# Patient Record
Sex: Female | Born: 1970 | Race: Black or African American | Hispanic: No | Marital: Single | Smoking: Current every day smoker
Health system: Southern US, Community
[De-identification: ages and names within clinical notes are randomized; demographics above are authoritative.]

## PROBLEM LIST (undated history)

## (undated) DIAGNOSIS — K922 Gastrointestinal hemorrhage, unspecified: Secondary | ICD-10-CM

## (undated) DIAGNOSIS — I10 Essential (primary) hypertension: Secondary | ICD-10-CM

## (undated) DIAGNOSIS — K269 Duodenal ulcer, unspecified as acute or chronic, without hemorrhage or perforation: Secondary | ICD-10-CM

## (undated) DIAGNOSIS — I7772 Dissection of iliac artery: Secondary | ICD-10-CM

## (undated) DIAGNOSIS — E872 Acidosis, unspecified: Secondary | ICD-10-CM

## (undated) DIAGNOSIS — D500 Iron deficiency anemia secondary to blood loss (chronic): Secondary | ICD-10-CM

## (undated) HISTORY — DX: Duodenal ulcer, unspecified as acute or chronic, without hemorrhage or perforation: K26.9

## (undated) HISTORY — DX: Iron deficiency anemia secondary to blood loss (chronic): D50.0

## (undated) HISTORY — DX: Gastrointestinal hemorrhage, unspecified: K92.2

## (undated) HISTORY — DX: Acidosis, unspecified: E87.20

## (undated) HISTORY — DX: Dissection of iliac artery: I77.72

## (undated) HISTORY — PX: LITHOTRIPSY: SUR834

## (undated) HISTORY — PX: MESENTERIC ARTERY BYPASS: SHX5968

## (undated) HISTORY — DX: Acidosis: E87.2

---

## 2016-04-23 ENCOUNTER — Encounter (HOSPITAL_COMMUNITY): Payer: Self-pay

## 2016-04-23 ENCOUNTER — Inpatient Hospital Stay (HOSPITAL_COMMUNITY)
Admission: EM | Admit: 2016-04-23 | Discharge: 2016-04-28 | DRG: 377 | Disposition: A | Discharge status: Home / Self Care | Payer: Medicaid Other | Attending: Internal Medicine | Admitting: Internal Medicine

## 2016-04-23 ENCOUNTER — Emergency Department (HOSPITAL_COMMUNITY): Payer: Medicaid Other

## 2016-04-23 ENCOUNTER — Inpatient Hospital Stay (HOSPITAL_COMMUNITY): Payer: Medicaid Other

## 2016-04-23 DIAGNOSIS — K5900 Constipation, unspecified: Secondary | ICD-10-CM | POA: Diagnosis present

## 2016-04-23 DIAGNOSIS — K922 Gastrointestinal hemorrhage, unspecified: Secondary | ICD-10-CM | POA: Diagnosis not present

## 2016-04-23 DIAGNOSIS — D649 Anemia, unspecified: Secondary | ICD-10-CM | POA: Diagnosis present

## 2016-04-23 DIAGNOSIS — I7772 Dissection of iliac artery: Secondary | ICD-10-CM | POA: Diagnosis present

## 2016-04-23 DIAGNOSIS — S3692XA Contusion of unspecified intra-abdominal organ, initial encounter: Secondary | ICD-10-CM

## 2016-04-23 DIAGNOSIS — B964 Proteus (mirabilis) (morganii) as the cause of diseases classified elsewhere: Secondary | ICD-10-CM | POA: Diagnosis present

## 2016-04-23 DIAGNOSIS — K264 Chronic or unspecified duodenal ulcer with hemorrhage: Secondary | ICD-10-CM | POA: Diagnosis present

## 2016-04-23 DIAGNOSIS — I10 Essential (primary) hypertension: Secondary | ICD-10-CM | POA: Diagnosis present

## 2016-04-23 DIAGNOSIS — F419 Anxiety disorder, unspecified: Secondary | ICD-10-CM | POA: Diagnosis not present

## 2016-04-23 DIAGNOSIS — E871 Hypo-osmolality and hyponatremia: Secondary | ICD-10-CM | POA: Diagnosis present

## 2016-04-23 DIAGNOSIS — M549 Dorsalgia, unspecified: Secondary | ICD-10-CM

## 2016-04-23 DIAGNOSIS — N390 Urinary tract infection, site not specified: Secondary | ICD-10-CM | POA: Diagnosis present

## 2016-04-23 DIAGNOSIS — Z6825 Body mass index (BMI) 25.0-25.9, adult: Secondary | ICD-10-CM | POA: Diagnosis not present

## 2016-04-23 DIAGNOSIS — R531 Weakness: Secondary | ICD-10-CM | POA: Diagnosis present

## 2016-04-23 DIAGNOSIS — A5900 Urogenital trichomoniasis, unspecified: Secondary | ICD-10-CM | POA: Diagnosis present

## 2016-04-23 DIAGNOSIS — T8189XA Other complications of procedures, not elsewhere classified, initial encounter: Secondary | ICD-10-CM | POA: Diagnosis present

## 2016-04-23 DIAGNOSIS — R58 Hemorrhage, not elsewhere classified: Secondary | ICD-10-CM | POA: Diagnosis present

## 2016-04-23 DIAGNOSIS — E872 Acidosis, unspecified: Secondary | ICD-10-CM

## 2016-04-23 DIAGNOSIS — D500 Iron deficiency anemia secondary to blood loss (chronic): Secondary | ICD-10-CM | POA: Diagnosis not present

## 2016-04-23 DIAGNOSIS — Z7902 Long term (current) use of antithrombotics/antiplatelets: Secondary | ICD-10-CM

## 2016-04-23 DIAGNOSIS — M419 Scoliosis, unspecified: Secondary | ICD-10-CM | POA: Diagnosis present

## 2016-04-23 DIAGNOSIS — M5440 Lumbago with sciatica, unspecified side: Secondary | ICD-10-CM | POA: Diagnosis not present

## 2016-04-23 DIAGNOSIS — R634 Abnormal weight loss: Secondary | ICD-10-CM | POA: Diagnosis present

## 2016-04-23 DIAGNOSIS — R0602 Shortness of breath: Secondary | ICD-10-CM

## 2016-04-23 DIAGNOSIS — E860 Dehydration: Secondary | ICD-10-CM | POA: Diagnosis present

## 2016-04-23 DIAGNOSIS — K648 Other hemorrhoids: Secondary | ICD-10-CM | POA: Diagnosis present

## 2016-04-23 DIAGNOSIS — Z79899 Other long term (current) drug therapy: Secondary | ICD-10-CM | POA: Diagnosis not present

## 2016-04-23 DIAGNOSIS — Y838 Other surgical procedures as the cause of abnormal reaction of the patient, or of later complication, without mention of misadventure at the time of the procedure: Secondary | ICD-10-CM | POA: Diagnosis present

## 2016-04-23 DIAGNOSIS — Z7982 Long term (current) use of aspirin: Secondary | ICD-10-CM

## 2016-04-23 DIAGNOSIS — R000 Tachycardia, unspecified: Secondary | ICD-10-CM | POA: Diagnosis present

## 2016-04-23 DIAGNOSIS — D62 Acute posthemorrhagic anemia: Secondary | ICD-10-CM | POA: Diagnosis present

## 2016-04-23 DIAGNOSIS — IMO0001 Reserved for inherently not codable concepts without codable children: Secondary | ICD-10-CM

## 2016-04-23 DIAGNOSIS — K269 Duodenal ulcer, unspecified as acute or chronic, without hemorrhage or perforation: Secondary | ICD-10-CM | POA: Diagnosis not present

## 2016-04-23 DIAGNOSIS — IMO0002 Reserved for concepts with insufficient information to code with codable children: Secondary | ICD-10-CM

## 2016-04-23 HISTORY — DX: Essential (primary) hypertension: I10

## 2016-04-23 LAB — BASIC METABOLIC PANEL
ANION GAP: 6 (ref 5–15)
BUN: 13 mg/dL (ref 6–20)
CHLORIDE: 104 mmol/L (ref 101–111)
CO2: 24 mmol/L (ref 22–32)
Calcium: 8.6 mg/dL — ABNORMAL LOW (ref 8.9–10.3)
Creatinine, Ser: 0.71 mg/dL (ref 0.44–1.00)
GFR calc non Af Amer: >60 mL/min (ref >60)
GLUCOSE: 107 mg/dL — AB (ref 65–99)
POTASSIUM: 3.5 mmol/L (ref 3.5–5.1)
Sodium: 134 mmol/L — ABNORMAL LOW (ref 135–145)

## 2016-04-23 LAB — COMPREHENSIVE METABOLIC PANEL
ALBUMIN: 3.9 g/dL (ref 3.5–5.0)
ALK PHOS: 61 U/L (ref 38–126)
ALT: 15 U/L (ref 14–54)
AST: 34 U/L (ref 15–41)
Anion gap: 11 (ref 5–15)
BUN: 21 mg/dL — AB (ref 6–20)
CHLORIDE: 101 mmol/L (ref 101–111)
CO2: 21 mmol/L — AB (ref 22–32)
CREATININE: 0.75 mg/dL (ref 0.44–1.00)
Calcium: 9.1 mg/dL (ref 8.9–10.3)
GFR calc Af Amer: >60 mL/min (ref >60)
GFR calc non Af Amer: >60 mL/min (ref >60)
GLUCOSE: 125 mg/dL — AB (ref 65–99)
Potassium: 4.3 mmol/L (ref 3.5–5.1)
SODIUM: 133 mmol/L — AB (ref 135–145)
Total Bilirubin: 0.8 mg/dL (ref 0.3–1.2)
Total Protein: 7.1 g/dL (ref 6.5–8.1)

## 2016-04-23 LAB — CBC WITH DIFFERENTIAL/PLATELET
BASOS ABS: 0 10*3/uL (ref 0.0–0.1)
Basophils Absolute: 0 10*3/uL (ref 0.0–0.1)
Basophils Relative: 0 %
Basophils Relative: 0 %
EOS ABS: 0 10*3/uL (ref 0.0–0.7)
EOS ABS: 0.1 10*3/uL (ref 0.0–0.7)
EOS PCT: 0 %
Eosinophils Relative: 1 %
HCT: 20.1 % — ABNORMAL LOW (ref 36.0–46.0)
HEMATOCRIT: 24.4 % — AB (ref 36.0–46.0)
HEMOGLOBIN: 6.7 g/dL — AB (ref 12.0–15.0)
HEMOGLOBIN: 8.3 g/dL — AB (ref 12.0–15.0)
LYMPHS ABS: 2.2 10*3/uL (ref 0.7–4.0)
LYMPHS PCT: 17 %
Lymphocytes Relative: 17 %
Lymphs Abs: 2.8 10*3/uL (ref 0.7–4.0)
MCH: 32.8 pg (ref 26.0–34.0)
MCH: 31.3 pg (ref 26.0–34.0)
MCHC: 33.3 g/dL (ref 30.0–36.0)
MCHC: 34 g/dL (ref 30.0–36.0)
MCV: 98.5 fL (ref 78.0–100.0)
MCV: 92.1 fL (ref 78.0–100.0)
MONOS PCT: 7 %
Monocytes Absolute: 0.9 10*3/uL (ref 0.1–1.0)
Monocytes Absolute: 0.9 10*3/uL (ref 0.1–1.0)
Monocytes Relative: 6 %
NEUTROS ABS: 9.6 10*3/uL — AB (ref 1.7–7.7)
NEUTROS PCT: 77 %
NEUTROS PCT: 75 %
Neutro Abs: 12.4 10*3/uL — ABNORMAL HIGH (ref 1.7–7.7)
PLATELETS: 400 10*3/uL (ref 150–400)
Platelets: 252 10*3/uL (ref 150–400)
RBC: 2.04 MIL/uL — AB (ref 3.87–5.11)
RBC: 2.65 MIL/uL — ABNORMAL LOW (ref 3.87–5.11)
RDW: 19.1 % — ABNORMAL HIGH (ref 11.5–15.5)
RDW: 19.4 % — ABNORMAL HIGH (ref 11.5–15.5)
WBC: 16.1 10*3/uL — AB (ref 4.0–10.5)
WBC: 12.8 10*3/uL — ABNORMAL HIGH (ref 4.0–10.5)

## 2016-04-23 LAB — I-STAT CG4 LACTIC ACID, ED: Lactic Acid, Venous: 2.86 mmol/L (ref 0.5–1.9)

## 2016-04-23 LAB — I-STAT CHEM 8, ED
BUN: 20 mg/dL (ref 6–20)
CREATININE: 0.6 mg/dL (ref 0.44–1.00)
Calcium, Ion: 1.15 mmol/L (ref 1.15–1.40)
Chloride: 97 mmol/L — ABNORMAL LOW (ref 101–111)
Glucose, Bld: 117 mg/dL — ABNORMAL HIGH (ref 65–99)
HEMATOCRIT: 20 % — AB (ref 36.0–46.0)
Hemoglobin: 6.8 g/dL — CL (ref 12.0–15.0)
POTASSIUM: 3.6 mmol/L (ref 3.5–5.1)
Sodium: 134 mmol/L — ABNORMAL LOW (ref 135–145)
TCO2: 25 mmol/L (ref 0–100)

## 2016-04-23 LAB — MRSA PCR SCREENING: MRSA by PCR: NEGATIVE

## 2016-04-23 LAB — URINALYSIS, ROUTINE W REFLEX MICROSCOPIC
Bilirubin Urine: NEGATIVE
GLUCOSE, UA: NEGATIVE mg/dL
KETONES UR: NEGATIVE mg/dL
Nitrite: NEGATIVE
PH: 6 (ref 5.0–8.0)
Protein, ur: NEGATIVE mg/dL
Specific Gravity, Urine: 1.01 (ref 1.005–1.030)

## 2016-04-23 LAB — PREPARE RBC (CROSSMATCH)

## 2016-04-23 LAB — PROTIME-INR
INR: 1.05
INR: 1.14
PROTHROMBIN TIME: 13.7 s (ref 11.4–15.2)
Prothrombin Time: 14.6 seconds (ref 11.4–15.2)

## 2016-04-23 LAB — URINE MICROSCOPIC-ADD ON

## 2016-04-23 LAB — ABO/RH: ABO/RH(D): O POS

## 2016-04-23 LAB — MAGNESIUM: Magnesium: 1.8 mg/dL (ref 1.7–2.4)

## 2016-04-23 LAB — LACTIC ACID, PLASMA: Lactic Acid, Venous: 1.7 mmol/L (ref 0.5–1.9)

## 2016-04-23 LAB — TSH: TSH: 0.725 u[IU]/mL (ref 0.350–4.500)

## 2016-04-23 LAB — I-STAT BETA HCG BLOOD, ED (MC, WL, AP ONLY): I-stat hCG, quantitative: <5 m[IU]/mL (ref <5)

## 2016-04-23 LAB — LIPASE, BLOOD: Lipase: 32 U/L (ref 11–51)

## 2016-04-23 MED ORDER — ACETAMINOPHEN 325 MG PO TABS: 650.0000 mg | ORAL_TABLET | Freq: Four times a day (QID) | ORAL | Status: DC | PRN

## 2016-04-23 MED ORDER — LIDOCAINE-EPINEPHRINE (PF) 1 %-1:200000 IJ SOLN
10.0000 mL | Freq: Once | INTRAMUSCULAR | Status: DC
  Filled 2016-04-23: qty 30

## 2016-04-23 MED ORDER — ONDANSETRON HCL 4 MG/2ML IJ SOLN
4.0000 mg | Freq: Four times a day (QID) | INTRAMUSCULAR | Status: DC | PRN
  Administered 2016-04-24 – 2016-04-25 (×3): 4 mg via INTRAVENOUS
  Filled 2016-04-23 (×5): qty 2

## 2016-04-23 MED ORDER — DEXTROSE-NACL 5-0.45 % IV SOLN
INTRAVENOUS | Status: DC
  Administered 2016-04-23: 14:00:00 via INTRAVENOUS

## 2016-04-23 MED ORDER — IOPAMIDOL (ISOVUE-370) INJECTION 76%
100.0000 mL | Freq: Once | INTRAVENOUS | Status: AC | PRN
  Administered 2016-04-23: 100 mL via INTRAVENOUS

## 2016-04-23 MED ORDER — TECHNETIUM TC 99M-LABELED RED BLOOD CELLS IV KIT
26.4000 | PACK | Freq: Once | INTRAVENOUS | Status: AC | PRN
  Administered 2016-04-23: 26.4 via INTRAVENOUS

## 2016-04-23 MED ORDER — ONDANSETRON HCL 4 MG PO TABS: 4.0000 mg | ORAL_TABLET | Freq: Four times a day (QID) | ORAL | Status: DC | PRN

## 2016-04-23 MED ORDER — METRONIDAZOLE 500 MG PO TABS
2000.0000 mg | ORAL_TABLET | Freq: Once | ORAL | Status: AC
  Administered 2016-04-23: 2000 mg via ORAL
  Filled 2016-04-23: qty 4

## 2016-04-23 MED ORDER — SODIUM CHLORIDE 0.9 % IV SOLN: 10.0000 mL/h | Freq: Once | INTRAVENOUS | Status: AC

## 2016-04-23 MED ORDER — METOPROLOL TARTRATE 12.5 MG HALF TABLET
12.5000 mg | ORAL_TABLET | Freq: Two times a day (BID) | ORAL | Status: DC
Start: 2016-04-23 — End: 2016-04-28
  Administered 2016-04-23 – 2016-04-28 (×11): 12.5 mg via ORAL
  Filled 2016-04-23 (×11): qty 1

## 2016-04-23 MED ORDER — PHENYLEPHRINE 200 MCG/ML FOR PRIAPISM / HYPOTENSION
500.0000 ug | Freq: Once | INTRAMUSCULAR | Status: DC
  Filled 2016-04-23: qty 50

## 2016-04-23 MED ORDER — SODIUM CHLORIDE 0.9 % IV BOLUS (SEPSIS)
1000.0000 mL | Freq: Once | INTRAVENOUS | Status: AC
  Administered 2016-04-23: 1000 mL via INTRAVENOUS

## 2016-04-23 MED ORDER — SODIUM CHLORIDE 0.9 % IV SOLN: 10.0000 mL/h | Freq: Once | INTRAVENOUS | Status: DC

## 2016-04-23 MED ORDER — BOOST / RESOURCE BREEZE PO LIQD
1.0000 | Freq: Three times a day (TID) | ORAL | Status: DC
  Administered 2016-04-24 – 2016-04-28 (×9): 1 via ORAL

## 2016-04-23 MED ORDER — ACETAMINOPHEN 650 MG RE SUPP: 650.0000 mg | Freq: Four times a day (QID) | RECTAL | Status: DC | PRN

## 2016-04-23 MED ORDER — CEFTRIAXONE SODIUM 1 G IJ SOLR: 1.0000 g | Freq: Once | INTRAMUSCULAR | Status: DC

## 2016-04-23 MED ORDER — MAGNESIUM HYDROXIDE 400 MG/5ML PO SUSP: 30.0000 mL | Freq: Every day | ORAL | Status: DC | PRN

## 2016-04-23 MED ORDER — ENSURE ENLIVE PO LIQD: 237.0000 mL | Freq: Two times a day (BID) | ORAL | Status: DC

## 2016-04-23 MED ORDER — AMLODIPINE BESYLATE 10 MG PO TABS
10.0000 mg | ORAL_TABLET | Freq: Every day | ORAL | Status: DC
  Administered 2016-04-23 – 2016-04-28 (×6): 10 mg via ORAL
  Filled 2016-04-23: qty 2
  Filled 2016-04-23 (×5): qty 1

## 2016-04-23 MED ORDER — SODIUM CHLORIDE 0.9 % IV SOLN
INTRAVENOUS | Status: DC
  Administered 2016-04-23 – 2016-04-24 (×4): via INTRAVENOUS
  Administered 2016-04-26: 100 mL/h via INTRAVENOUS
  Administered 2016-04-27: 12:00:00 via INTRAVENOUS

## 2016-04-23 MED ORDER — HYDROCODONE-ACETAMINOPHEN 5-325 MG PO TABS
1.0000 | ORAL_TABLET | ORAL | Status: DC | PRN
  Administered 2016-04-24 – 2016-04-28 (×10): 2 via ORAL
  Filled 2016-04-23 (×10): qty 2

## 2016-04-23 MED ORDER — METRONIDAZOLE IVPB CUSTOM: 2.0000 g | Freq: Once | INTRAVENOUS | Status: DC

## 2016-04-23 NOTE — Progress Notes (Signed)
[facility] Update Note  Called to bedside about GI bleed. Consulted GI who will see patient. Examined paitent alert and oriented, no neurological deficits, Ultram these well perfused, no abdominal pain, normal bowel sounds, on rectal exam there is frank blood but no other abnormalities. Regular rate and rhythm, no murmurs or gallops. Lungs clear to all fixation bilaterally.  A mix of bright red blood and melena in the commode in the patient's room.  Repeat CBC has been ordered. Patient to get type and screened.  Will call Vascular surgery.  Elwin Mocha MD

## 2016-04-23 NOTE — ED Notes (Signed)
This RN attempted IV x1 

## 2016-04-23 NOTE — ED Notes (Signed)
Two RNs have attempted to obtain IV access. IV team consult put in and EDP notified

## 2016-04-23 NOTE — ED Notes (Signed)
Called CT to inquire about delay, they stated pt is next to have CT done

## 2016-04-23 NOTE — Consult Note (Signed)
Referring Provider:  [facility] Primary Care Physician:  No PCP Per Patient Primary Gastroenterologist:  none Reason for Consultation:  GI bleed  HPI: Michelle Joyce is a 45 y.o. female from [place] who presented to ED around midnight with weakness and vomiting. Patient was in good health until late September when she was hospitalized three times. Patient presented to [facility] in [place] in September with abdominal pain, she underwent SMA stenting for stenosis. Started on plavix post-procedure.  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 Following that she developed a right groin hematoma (surgically evacuated ) and a retroperitoneal hematoma. Following all this patient was admitted a third time with constipation  / impaction. After bowel purge patient felt much better. She did require blood transfusion during two of the recent admissions. Patient had not passed any blood, stools were dark but not black. There was no mention of GI bleeding during any of those admission.    Patient came to [facility] on Saturday, she plans to relocate here to be with family. Yesterday she became extremely weak with nausea and loose nonbloody stool. She reports 2 syncopal episodes at home. Patient went to [facility] ED earlier this am.  Hgb 6-7. CTA showed hematoma extending from right inguinal and right iliac vessels into the pelvis, likely subacute, patient transferred to [facility] in case surgical intervention was needed. Upon arriving to [facility] patient had a large dark bloody BM. This is the FIRST time patient has ever passed blood. She vomited yesterday (nonbloody) with syncopal episodes but otherwise no vomiting. She has no abdominal pain. Her LMP ws ~ 2 years ago.   Past Medical History:  Diagnosis Date  . Hypertension     Past Surgical History:  Procedure Laterality Date  . MESENTERIC ARTERY BYPASS      Prior to Admission medications     Medication Sig Start Date End Date Taking? Authorizing Provider  amLODipine (NORVASC) 10 MG tablet Take 10 mg by mouth daily.   Yes Historical Provider, MD  aspirin EC 81 MG tablet Take 81 mg by mouth daily.   Yes Historical Provider, MD  clopidogrel (PLAVIX) 75 MG tablet Take 75 mg by mouth daily.   Yes Historical Provider, MD  metoprolol succinate (TOPROL-XL) 25 MG 24 hr tablet Take 12.5 mg by mouth daily.   Yes Historical Provider, MD  ondansetron (ZOFRAN-ODT) 4 MG disintegrating tablet Take 4 mg by mouth every 6 (six) hours as needed for nausea or vomiting.   Yes Historical Provider, MD  polyethylene glycol (MIRALAX / GLYCOLAX) packet Take 17 g by mouth daily.   Yes Historical Provider, MD    Current Facility-Administered Medications  Medication Dose Route Frequency Provider Last Rate Last Dose  . 0.9 %  sodium chloride infusion  10 mL/hr Intravenous Once Everlene Balls, MD      . 0.9 %  sodium chloride infusion   Intravenous Continuous Elwin Mocha, MD      . acetaminophen (TYLENOL) tablet 650 mg  650 mg Oral Q6H PRN Debbe Odea, MD       Or  . acetaminophen (TYLENOL) suppository 650 mg  650 mg Rectal Q6H PRN Debbe Odea, MD      . amLODipine (NORVASC) tablet 10 mg  10 mg Oral Daily Debbe Odea, MD   10 mg at 04/23/16 1031  . dextrose 5 %-0.45 % sodium chloride infusion   Intravenous Continuous Debbe Odea, MD 75 mL/hr at 04/23/16 1400    .  feeding supplement (BOOST / RESOURCE BREEZE) liquid 1 Container  1 Container Oral TID BM Rise Patience, MD      . HYDROcodone-acetaminophen (NORCO/VICODIN) 5-325 MG per tablet 1-2 tablet  1-2 tablet Oral Q4H PRN Debbe Odea, MD      . magnesium hydroxide (MILK OF MAGNESIA) suspension 30 mL  30 mL Oral Daily PRN Debbe Odea, MD      . metoprolol tartrate (LOPRESSOR) tablet 12.5 mg  12.5 mg Oral BID Debbe Odea, MD   12.5 mg at 04/23/16 1032  . ondansetron (ZOFRAN) tablet 4 mg  4 mg Oral Q6H PRN Debbe Odea, MD       Or  . ondansetron  (ZOFRAN) injection 4 mg  4 mg Intravenous Q6H PRN Debbe Odea, MD        Allergies as of 04/23/2016  . (No Known Allergies)    History reviewed. No pertinent family history.  Social History   Social History  . Marital status: Single    Spouse name: N/A  . Number of children: N/A  . Years of education: N/A   Occupational History  . Not on file.   Social History Main Topics  . Smoking status: Never Smoker  . Smokeless tobacco: Never Used  . Alcohol use No  . Drug use: Unknown  . Sexual activity: Not on file   Other Topics Concern  . Not on file   Social History Narrative  . No narrative on file    Review of Systems: Positive for 15-20 pound weight loss since September. All systems reviewed and negative except where noted in HPI.  Physical Exam: Vital signs in last 24 hours: Temp:  [98.3 F (36.8 C)-99.2 F (37.3 C)] 99.2 F (37.3 C) (10/20 1330) Pulse Rate:  [93-131] 94 (10/20 1330) Resp:  [12-24] 24 (10/20 1330) BP: (115-168)/(73-102) 138/86 (10/20 1330) SpO2:  [98 %-100 %] 100 % (10/20 1330) Weight:  [150 lb 9.2 oz (68.3 kg)] 150 lb 9.2 oz (68.3 kg) (10/20 0915)   General:   Alert,  well-developed, black female in NAD Head:  Normocephalic and atraumatic. Eyes:  Sclera clear, no icterus.   Conjunctiva pale. Ears:  Normal auditory acuity. Nose:  No deformity, discharge,  or lesions. Neck:  Supple; no masses  Lungs:  Clear throughout to auscultation.   No wheezes, crackles, or rhonchi.  Heart:  Regular rate and rhythm; no murmurs, clicks, rubs,  or gallops. Abdomen:  Soft, nondistended, a few bowel sounds. Mild epigastric tenderness,  Rectal:  Scant dark red secretions in vault.   Msk:  Symmetrical without gross deformities. . Pulses:  Normal pulses noted. Extremities:  Without clubbing or edema. Slightly malodorous right groin wound at site of recent vascular surgeries.  Neurologic:  Alert and  oriented x4;  grossly normal neurologically. Skin:  Intact  without significant lesions or rashes.. Psych:  Alert and cooperative. Normal mood and affect.  Intake/Output from previous day: 10/19 0701 - 10/20 0700 In: 323 [Blood:323] Out: 300 [Urine:300] Intake/Output this shift: Total I/O In: 366.3 [I.V.:31.3; Blood:335] Out: -   Lab Results:  Recent Labs  04/23/16 0111 04/23/16 0253  WBC 16.1*  --   HGB 6.7* 6.8*  HCT 20.1* 20.0*  PLT 400  --    BMET  Recent Labs  04/23/16 0111 04/23/16 0253  NA 133* 134*  K 4.3 3.6  CL 101 97*  CO2 21*  --   GLUCOSE 125* 117*  BUN 21* 20  CREATININE 0.75 0.60  CALCIUM 9.1  --  LFT  Recent Labs  04/23/16 0111  PROT 7.1  ALBUMIN 3.9  AST 34  ALT 15  ALKPHOS 61  BILITOT 0.8   PT/INR  Recent Labs  04/23/16 0111  LABPROT 13.7  INR 1.05   Studies/Results: Ct Angio Abd/pel W And/or Wo Contrast  Result Date: 04/23/2016 CLINICAL DATA:  45 year old female with 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 Patient presenting with weakness and abdominal pain. EXAM: CTA ABDOMEN AND PELVIS wITHOUT AND WITH CONTRAST TECHNIQUE: Multidetector CT imaging of the abdomen and pelvis was performed using the standard protocol during bolus administration of intravenous contrast. Multiplanar reconstructed images and MIPs were obtained and reviewed to evaluate the vascular anatomy. CONTRAST:  100 cc Isovue 370 COMPARISON:  None. FINDINGS: Evaluation is limited due to respiratory motion artifact. VASCULAR Aorta: There is diffuse predominantly noncalcified plaque along the course of the abdominal aorta. No aneurysmal dilatation or evidence of dissection. There is partial occlusion of the right external iliac artery with a double-lumen appearance of the distal portion of the right external iliac artery concerning for dissection, possibly iatrogenic. Evaluation of these vessels however very limited due to motion artifact. There is also partial occlusion of the right common femoral artery.  There is a 8.2 x 8.2 cm walled-off high attenuating fluid collection extending from the right inguinal and right iliac area into the pelvis above the bladder most compatible with a hematoma, possibly subacute. No definite extravasation of contrast noted within this collection to suggest active bleed. Superimposed infection is not excluded. Celiac: The origin of the celiac axis appears patent. There is an accessory left hepatic artery arising from the left gastric artery. SMA: A SMA stent is noted and appears patent. Evaluation of the stent however is limited due to respiratory motion artifact. No definite extravasation of contrast or teres stent infiltration identified. The distal SMA appears patent. Renals: Both renal arteries are patent without evidence of aneurysm, dissection, vasculitis, fibromuscular dysplasia or significant stenosis. IMA: Appears unremarkable as visualized. Veins: The IVC is grossly unremarkable. The SMV, splenic vein, and main portal vein are patent. No portal venous gas identified. Review of the MIP images confirms the above findings. NON-VASCULAR Lower chest: The visualized lung bases are clear. No intra-abdominal free air or free fluid Hepatobiliary: The liver appears unremarkable. No intrahepatic biliary ductal dilatation. Vicarious excretion of contrast into the gallbladder noted. Pancreas: Unremarkable. No pancreatic ductal dilatation or surrounding inflammatory changes. Spleen: Normal in size without focal abnormality. Adrenals/Urinary Tract: Ill-defined 1.4 cm nodularity of the left adrenal gland, indeterminate. The right adrenal gland appears unremarkable. The kidneys, visualized ureters appear unremarkable. There is diffuse thickened appearance of the bladder wall which may be secondary to inflammatory changes and hematoma within the pelvis. Correlation with urinalysis recommended to evaluate for cystitis. Stomach/Bowel: Multiple nondistended fluid filled loops of small bowel noted  throughout the abdomen. Loose stool noted within the colon compatible with diarrheal state. There is no evidence of bowel obstruction. Lymphatic: No adenopathy. Reproductive: The uterus is grossly unremarkable. Other: Right inguinal subcutaneous stranding/scarring, likely related to femoral access. There is a small supraumbilical fat containing hernia. Musculoskeletal: No acute or significant osseous findings. IMPRESSION: VASCULAR No evidence of aortic aneurysm or dissection. SMA stent appears unremarkable and patent. Double-lumen appearance of the right external iliac artery concerning for dissection, possibly iatrogenic. There is an 8.2 x 8.2 cm loculated hematoma extending from the right inguinal and right iliac vessels into the pelvis above the urinary bladder, likely subacute. No definite  evidence of active bleed. Evaluation of the vasculature is limited due to motion artifact. NON-VASCULAR No acute intra-abdominal pelvic pathology. **An incidental finding of potential clinical significance has been found. A 1.4 cm probable left adrenal nodule, indeterminate. Nonemergent MRI without and with contrast is recommended for further evaluation.** Thickened appearance of the bladder wall, possibly reactive to the hematoma within the pelvis. Correlation with urinalysis recommended to exclude cystitis. These results were called by telephone at the time of interpretation on 04/23/2016 at 6:35 am to Dr. Everlene Balls , who verbally acknowledged these results. Electronically Signed   By: Anner Crete M.D.   On: 04/23/2016 06:36    IMPRESSION / PLAN:    23. 45 year old female with painless hematochezia on plavix and ASA. Hgb down a couple of grams since Oct 8th. 8.2 >>>> 6.7 today.  In toilet there is a large amount of dark red liquid which just started this afternoon..  -hold plavix -will drop NGT to look for upper bleed, if + then EGD now. If negative then RBC scan.     2. Anemia of acute blood loss. Received  several units of blood in [place] in Sept following vascular procedures complicated by right groin and retroperitoneal hematoma. Hgb low 8 range upon hospital discharge in [place] around Oct 8th. Presents today with hgb of 6.7 ( Transfused 2 units of blood, follow up hgb only 6.8. Now having new onset hematochezia so suspect hgb has fallen further since last H+H.  -repeat stat BMET, CBC, INR stat.  -NGT to aspirate gastric contents to help discern if this is brisk upper bleed though she seems too hemodynamically stable for that. -Will likely additional transfusion  2. 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 Following this she developed a right groin hematoma (requring surgical evacuation) and a right retroperitoneal bleed).  Presented to [facility] ED today with weakness, CTA suggested dissection of right external iliac artery. Vascular Surgery recommends conservative measures for now.     Tye Savoy  04/23/2016, 4:55 PM  Pager number [phone]

## 2016-04-23 NOTE — ED Notes (Signed)
Bed: WA09 Expected date: 04/23/16 Expected time:  Means of arrival:  Comments: Hold/ pt in room

## 2016-04-23 NOTE — ED Notes (Signed)
Per pt's request transfusion restarted, Dr Claudine Mouton evaluated IV site.

## 2016-04-23 NOTE — ED Notes (Signed)
New IV line established, transfusion restarted, pt denies any pain or discomfort at this time

## 2016-04-23 NOTE — Consult Note (Signed)
[facility] Nurse wound consult note Reason for Consult: Right inguinal nonhealing surgical wound.  May be trainsferring to [facility] bedside RN Wound type:surgical Pressure Ulcer POA: N/A Measurement:0.5 cm in diameter, depth 0.5 cm  Wound [id number] pink, nongranulating Drainage (amount, consistency, odor) Minimal serosanguinous.  No odor Periwound:Intact Dressing procedure/placement/frequency:Cleanse wound to right inguinal fold with NS.  NS moist packing strip to wound twice daily.  Cover with 4x4 gauze/tape.  Will not follow at this time.  Please re-consult if needed.  Domenic Moras RN BSN [address] Pager [phone]

## 2016-04-23 NOTE — ED Notes (Signed)
Transfusion stopped again due to pt c/o pain and heaviness, Dr Claudine Mouton at bedside for ultrasound IV insertion

## 2016-04-23 NOTE — ED Notes (Signed)
Pt refused hemoccult collection.

## 2016-04-23 NOTE — Consult Note (Signed)
Consult Note  Patient name: Michelle Joyce MRN: [id number] DOB: February 28, 1971 Sex: female  Consulting Physician:  Hospital service  Reason for Consult:  Chief Complaint  Patient presents with  . Abdominal Pain    HISTORY OF PRESENT ILLNESS: 45 year old female presented to the ER last night with weakness.  She has just moved from [place].  She has a recent history of SMA stenting for abdominal pain in [place].  This was complicated by what sounds like a pseudoaneurysm from the right femoral cannulation site.  She underwent several groin explorations for bleeding control and still ahs a open wound from the incisions.  While in the ER a CT scan reveled a right EIA and CFA dissection and blood in the pelvis which does not appear acute.  She was transferred to [facility] and just had a stool with melanotic and bright red blood.  Gi has been consulted.  She has been taking multiple medications for constipation.  She was noted to have Trichomonas UTI   Past Medical History:  Diagnosis Date  . Hypertension     Past Surgical History:  Procedure Laterality Date  . MESENTERIC ARTERY BYPASS      Social History   Social History  . Marital status: Single    Spouse name: N/A  . Number of children: N/A  . Years of education: N/A   Occupational History  . Not on file.   Social History Main Topics  . Smoking status: Never Smoker  . Smokeless tobacco: Never Used  . Alcohol use No  . Drug use: Unknown  . Sexual activity: Not on file   Other Topics Concern  . Not on file   Social History Narrative  . No narrative on file    History reviewed. No pertinent family history.  Allergies as of 04/23/2016  . (No Known Allergies)    No current facility-administered medications on file prior to encounter.    No current outpatient prescriptions on file prior to encounter.     REVIEW OF SYSTEMS: Complains of having to spit a lot/ too much saliva 15- 20 lb wt loss since  Sept Numbness in right inner thigh Wound in right groin- packing with gauze soaked in saline twice a day  All other systems reviewed and apart from HPI, are negative.  PHYSICAL EXAMINATION: General: The patient appears their stated age.  Vital signs are BP 136/84 (BP Location: Right Arm)   Pulse 97   Temp 98.6 F (37 C) (Oral)   Resp 18   Ht 5\' 4"  (1.626 m)   Wt 150 lb 9.2 oz (68.3 kg)   LMP 04/23/2014 (Approximate) Comment: negative beta HCG 04/23/16  SpO2 98%   BMI 25.85 kg/m  Pulmonary: Respirations are non-labored HEENT:  No gross abnormalities Abdomen: Soft and non-tender  Musculoskeletal: There are no major deformities.   Neurologic: No focal weakness or paresthesias are detected, Skin: There are no ulcer or rashes noted. Psychiatric: The patient has normal affect. Cardiovascular: There is a regular rate and rhythm.  Palpable bilateral DP pulsses  Diagnostic Studies: I have reviewed her CTA with the following findings: No evidence of aortic aneurysm or dissection. SMA stent appears unremarkable and patent.  Double-lumen appearance of the right external iliac artery concerning for dissection, possibly iatrogenic. There is an 8.2 x 8.2 cm loculated hematoma extending from the right inguinal and right iliac vessels into the pelvis above the urinary bladder, likely subacute. No definite evidence of  active bleed. Evaluation of the vasculature is limited due to motion artifact.   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  Given her recent multiple interventions, I would not recommend any treatment at this time, other than anti-platelet medications.  He RP hematoma appears to be similar to what is described from her CT scan reports from [place].  No exploration is indicated  GI bleed:  I suspect this is secondary to her laxitive  use for constipation and fecal imapction.  I would work this up like a GI bleed.  I do not think this appears to be a fistula to her arterial system.  She is scheduled for a tagged red cell scan.  If she required IR embolization, it would be beneficial to evaluate her right iliac and femoral arteries     V. Leia Alf, M.D. [facility] Office: [phone] Pager:  [phone]

## 2016-04-23 NOTE — ED Notes (Signed)
Patient transported to CT 

## 2016-04-23 NOTE — ED Notes (Signed)
Transfusion stopped due to pt c/o pain at the IV site, EDP notified

## 2016-04-23 NOTE — H&P (Signed)
History and Physical    Michelle Joyce [id number] DOB: 1970-07-08 DOA: 04/23/2016    PCP: No PCP Per Patient  Patient coming from: home  Chief Complaint: weakness  HPI: Michelle Joyce is a 45 y.o. 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 She was hospitalized for 2 weeks and required 2 blood transfusions during the hospital stay. She presents today for weakness that started this morning. She states that she does not have any pain. She has had 2 episodes of diarrhea this morning and almost passed out while on the commode. She states she felt hot, broke out in a sweat. She takes Miralax for constipation.  She is mildly nauseated now but has not had any vomiting. No abdominal pain, fever or chills.  Noted to have Trichomonas UTI - does not admit to urinary symptoms  ED Course: Hemoglobin 6.7, WBC count 16.1, lactic acid 2.86, sodium 134, chloride 97, UA shows moderate leukocytosis, trace hemoglobin, Trichomonas and too numerous to count WBCs. CTA shows a patent SMA stent, possible dissection of right internal iliac artery, 8.2 x 8.2 cm loculated hematoma extending from the right inguinal and right iliac vessels into the pelvis above the urinary bladder, likely subacute to 1.4 cm left adrenal nodule.  Review of Systems:  Complains of having to spit a lot/ too much saliva 15- 20 lb wt loss since Sept Numbness in right inner thigh Wound in right groin- packing with gauze soaked in saline twice a day  All other systems reviewed and apart from HPI, are negative.  Past Medical History:  Diagnosis Date  . Hypertension     Past Surgical History:  Procedure Laterality Date  . MESENTERIC ARTERY BYPASS      Social History:   reports that she has never smoked. She has never used smokeless tobacco. She reports that she does not drink alcohol. Her drug history is not on file.  -  Moved from [place] about 1 wk ago. Staying with her aunt here  No Known Allergies  History reviewed. No pertinent family history.   Prior to Admission medications   Medication Sig Start Date End Date Taking? Authorizing Provider  amLODipine (NORVASC) 10 MG tablet Take 10 mg by mouth daily.   Yes Historical Provider, MD  aspirin EC 81 MG tablet Take 81 mg by mouth daily.   Yes Historical Provider, MD  clopidogrel (PLAVIX) 75 MG tablet Take 75 mg by mouth daily.   Yes Historical Provider, MD  ondansetron (ZOFRAN-ODT) 4 MG disintegrating tablet Take 4 mg by mouth every 6 (six) hours as needed for nausea or vomiting.   Yes Historical Provider, MD  polyethylene glycol (MIRALAX / GLYCOLAX) packet Take 17 g by mouth daily.   Yes Historical Provider, MD    Physical Exam: Vitals:   04/23/16 [id number] 04/23/16 0645 04/23/16 0649 04/23/16 0715  BP: 148/90 148/89  160/91  Pulse:      Resp: 20 16  18   Temp:   98.6 F (37 C)   TempSrc:      SpO2:   100%       Constitutional: NAD, calm, comfortable Eyes: PERTLA, lids and conjunctivae normal ENMT: Mucous membranes are moist. Posterior pharynx clear of any exudate or lesions. Normal dentition.  Neck: normal, supple, no masses, no thyromegaly Respiratory: clear to auscultation bilaterally, no wheezing, no crackles. Normal respiratory effort. No accessory muscle use.  Cardiovascular: S1 & S2  heard, regular rate and rhythm, no murmurs / rubs / gallops. No extremity edema. 2+ pedal pulses. No carotid bruits.  Abdomen: No distension, tender in right groin, no masses palpated. No hepatosplenomegaly. Bowel sounds normal.  Musculoskeletal: no clubbing / cyanosis. No joint deformity upper and lower extremities. Good ROM, no contractures. Normal muscle tone.  Skin:  Small round wound about 2 cm in right groin, no discharge or cellulitic changes, no otherrashes, lesions, ulcers. No induration Neurologic: CN 2-12 grossly intact. Sensation intact, DTR normal.  Strength 5/5 in all 4 limbs.  Psychiatric: Normal judgment and insight. Alert and oriented x 3. Flat affect    Labs on Admission: I have personally reviewed following labs and imaging studies  CBC:  Recent Labs Lab 04/23/16 0111 04/23/16 0253  WBC 16.1*  --   NEUTROABS 12.4*  --   HGB 6.7* 6.8*  HCT 20.1* 20.0*  MCV 98.5  --   PLT 400  --    Basic Metabolic Panel:  Recent Labs Lab 04/23/16 0111 04/23/16 0253  NA 133* 134*  K 4.3 3.6  CL 101 97*  CO2 21*  --   GLUCOSE 125* 117*  BUN 21* 20  CREATININE 0.75 0.60  CALCIUM 9.1  --   MG 1.8  --    GFR: CrCl cannot be calculated (Unknown ideal weight.). Liver Function Tests:  Recent Labs Lab 04/23/16 0111  AST 34  ALT 15  ALKPHOS 61  BILITOT 0.8  PROT 7.1  ALBUMIN 3.9    Recent Labs Lab 04/23/16 0111  LIPASE 32   No results for input(s): AMMONIA in the last 168 hours. Coagulation Profile:  Recent Labs Lab 04/23/16 0111  INR 1.05   Cardiac Enzymes: No results for input(s): CKTOTAL, CKMB, CKMBINDEX, TROPONINI in the last 168 hours. BNP (last 3 results) No results for input(s): PROBNP in the last 8760 hours. HbA1C: No results for input(s): HGBA1C in the last 72 hours. CBG: No results for input(s): GLUCAP in the last 168 hours. Lipid Profile: No results for input(s): CHOL, HDL, LDLCALC, TRIG, CHOLHDL, LDLDIRECT in the last 72 hours. Thyroid Function Tests: No results for input(s): TSH, T4TOTAL, FREET4, T3FREE, THYROIDAB in the last 72 hours. Anemia Panel: No results for input(s): VITAMINB12, FOLATE, FERRITIN, TIBC, IRON, RETICCTPCT in the last 72 hours. Urine analysis:    Component Value Date/Time   COLORURINE YELLOW 04/23/2016 0429   APPEARANCEUR CLOUDY (A) 04/23/2016 0429   LABSPEC 1.010 04/23/2016 0429   PHURINE 6.0 04/23/2016 0429   GLUCOSEU NEGATIVE 04/23/2016 0429   HGBUR TRACE (A) 04/23/2016 0429   BILIRUBINUR NEGATIVE 04/23/2016 0429   KETONESUR NEGATIVE 04/23/2016 0429    PROTEINUR NEGATIVE 04/23/2016 0429   NITRITE NEGATIVE 04/23/2016 0429   LEUKOCYTESUR MODERATE (A) 04/23/2016 0429   Sepsis Labs: @LABRCNTIP (procalcitonin:4,lacticidven:4) )No results found for this or any previous visit (from the past 240 hour(s)).   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 Patient presenting with weakness and abdominal pain. EXAM: CTA ABDOMEN AND PELVIS wITHOUT AND WITH CONTRAST TECHNIQUE: Multidetector CT imaging of the abdomen and pelvis was performed using the standard protocol during bolus administration of intravenous contrast. Multiplanar reconstructed images and MIPs were obtained and reviewed to evaluate the vascular anatomy. CONTRAST:  100 cc Isovue 370 COMPARISON:  None. FINDINGS: Evaluation is limited due to respiratory motion artifact. VASCULAR Aorta: There is diffuse predominantly noncalcified  plaque along the course of the abdominal aorta. No aneurysmal dilatation or evidence of dissection. There is partial occlusion of the right external iliac artery with a double-lumen appearance of the distal portion of the right external iliac artery concerning for dissection, possibly iatrogenic. Evaluation of these vessels however very limited due to motion artifact. There is also partial occlusion of the right common femoral artery. There is a 8.2 x 8.2 cm walled-off high attenuating fluid collection extending from the right inguinal and right iliac area into the pelvis above the bladder most compatible with a hematoma, possibly subacute. No definite extravasation of contrast noted within this collection to suggest active bleed. Superimposed infection is not excluded. Celiac: The origin of the celiac axis appears patent. There is an accessory left hepatic artery arising from the left gastric artery. SMA: A SMA stent is noted and  appears patent. Evaluation of the stent however is limited due to respiratory motion artifact. No definite extravasation of contrast or teres stent infiltration identified. The distal SMA appears patent. Renals: Both renal arteries are patent without evidence of aneurysm, dissection, vasculitis, fibromuscular dysplasia or significant stenosis. IMA: Appears unremarkable as visualized. Veins: The IVC is grossly unremarkable. The SMV, splenic vein, and main portal vein are patent. No portal venous gas identified. Review of the MIP images confirms the above findings. NON-VASCULAR Lower chest: The visualized lung bases are clear. No intra-abdominal free air or free fluid Hepatobiliary: The liver appears unremarkable. No intrahepatic biliary ductal dilatation. Vicarious excretion of contrast into the gallbladder noted. Pancreas: Unremarkable. No pancreatic ductal dilatation or surrounding inflammatory changes. Spleen: Normal in size without focal abnormality. Adrenals/Urinary Tract: Ill-defined 1.4 cm nodularity of the left adrenal gland, indeterminate. The right adrenal gland appears unremarkable. The kidneys, visualized ureters appear unremarkable. There is diffuse thickened appearance of the bladder wall which may be secondary to inflammatory changes and hematoma within the pelvis. Correlation with urinalysis recommended to evaluate for cystitis. Stomach/Bowel: Multiple nondistended fluid filled loops of small bowel noted throughout the abdomen. Loose stool noted within the colon compatible with diarrheal state. There is no evidence of bowel obstruction. Lymphatic: No adenopathy. Reproductive: The uterus is grossly unremarkable. Other: Right inguinal subcutaneous stranding/scarring, likely related to femoral access. There is a small supraumbilical fat containing hernia. Musculoskeletal: No acute or significant osseous findings. IMPRESSION: VASCULAR No evidence of aortic aneurysm or dissection. SMA stent appears  unremarkable and patent. Double-lumen appearance of the right external iliac artery concerning for dissection, possibly iatrogenic. There is an 8.2 x 8.2 cm loculated hematoma extending from the right inguinal and right iliac vessels into the pelvis above the urinary bladder, likely subacute. No definite evidence of active bleed. Evaluation of the vasculature is limited due to motion artifact. NON-VASCULAR No acute intra-abdominal pelvic pathology. **An incidental finding of potential clinical significance has been found. A 1.4 cm probable left adrenal nodule, indeterminate. Nonemergent MRI without and with contrast is recommended for further evaluation.** Thickened appearance of the bladder wall, possibly reactive to the hematoma within the pelvis. Correlation with urinalysis recommended to exclude cystitis. These results were called by telephone at the time of interpretation on 04/23/2016 at 6:35 am to Dr. Everlene Balls , who verbally acknowledged these results. Electronically Signed   By: Anner Crete M.D.   On: 04/23/2016 06:36    EKG: Independently reviewed. Sinus tachycardia at 128 bpm  Assessment/Plan Principal Problem:   Dissection of right iliac artery  - hold ASA and Plavix - Appears subacute and bleeding appears to  have resolved-vascular surgery recommends conservative management - follow serial Hb - obtaining records from prior hospital stay - transfer to [facility] in case urgent procedure needed  Active Problems:   Acute blood loss anemia - last Hb 8.2 on 10/8 per discharge papers that she has brought in -Transfusing 2 units of packed red blood cells -Check anemia panel    Hyponatremia   Lactic acidosis   Tachycardia   Elevated BUN/creatinine ratio   Dehydration   Near syncope -continue slow fluids after blood transfusion  Nausea, diarrhea - benign abdominal exam- treat symptomatically   Trichomonas UTI, leukocytosis - Flagyl 2 gm once given in ER -Check for  chlamydia/gonorrhea, RPR, HIV  Wound right groin - as a result of procedures - cont dressing per patient- will also consult wound care  H/o HTN - cont Amlodipine and Toprol (12.5 mg daily)  Complaint of always being tachycardic - check TFTs- last TSH on 10/8 was 1.660  SIRS - tachycardia, leukocytosis   DVT prophylaxis: SCD Code Status: Full code Family Communication:  Aunt at bedside Disposition Plan: Admit to stepdown unit  Consults called: called by ER- spoke with Dr Scot Dock Admission status: admit     [facility] MD [facility] Pager: [email or web address] Password [email or web address] 7PM-7AM, please contact night-coverage   04/23/2016, 8:02 AM

## 2016-04-23 NOTE — ED Provider Notes (Signed)
[facility] DEPT Provider Note   CSN: [id number] Arrival date & time: 04/23/16  0003  By signing my name below, I, Gwenlyn Fudge, attest that this documentation has been prepared under the direction and in the presence of Everlene Balls, MD. Electronically Signed: Gwenlyn Fudge, ED Scribe. 04/23/16. 1:10 AM.   History   Chief Complaint Chief Complaint  Patient presents with  . Abdominal Pain   The history is provided by the patient. No language interpreter was used.   HPI Comments: Michelle Joyce is a 45 y.o. female who presents to the Emergency Department complaining of gradual onset, weakness onset today. She states she could ambulate after her surgeries, but has experienced increased weakness over the past week. Pt reports associated headache.  Pt also complaining of abdominal pain s/p 3 surgeries. Pt had high blood pressure and had mesenteric artery bypass surgery to place a stent in a clogged artery to her intestines in 9/17. Had 2nd surgery for an aneurysm. 3rd surgery for hole in artery. While in hospital she had 2 blood transfusions. Was seen in ED 3x for severe abdominal pain that was diagnosed as a blockage. Has received laxatives, but has not been completely compliant with medications.She has been experiencing soft stools. She has felt lightheaded and has experienced syncope while attempting to move her bowels. She become diaphoretic and nauseous before feeling lightheaded.  Denies blood in stool or melena.   Past Medical History:  Diagnosis Date  . Hypertension    There are no active problems to display for this patient.  Past Surgical History:  Procedure Laterality Date  . MESENTERIC ARTERY BYPASS      OB History    No data available     Home Medications    Prior to Admission medications   Not on File   Family History History reviewed. No pertinent family history.  Social History Social History  Substance Use Topics  . Smoking status: Never Smoker  .  Smokeless tobacco: Never Used  . Alcohol use No    Allergies   Review of patient's allergies indicates no known allergies.  Review of Systems Review of Systems 10 Systems reviewed and are negative for acute change except as noted in the HPI.  Physical Exam Updated Vital Signs BP 115/79 (BP Location: Right Arm)   Pulse (!) 131   Temp 98.9 F (37.2 C) (Oral)   Resp 20   SpO2 100%   Physical Exam  Constitutional: She is oriented to person, place, and time. She appears well-developed and well-nourished. No distress.  HENT:  Head: Normocephalic and atraumatic.  Nose: Nose normal.  Mouth/Throat: Oropharynx is clear and moist. No oropharyngeal exudate.  Eyes: EOM are normal. Pupils are equal, round, and reactive to light. No scleral icterus.  Pale conjunctivae bilaterally  Neck: Normal range of motion. Neck supple. No JVD present. No tracheal deviation present. No thyromegaly present.  Cardiovascular: Normal rate, regular rhythm and normal heart sounds.  Exam reveals no gallop and no friction rub.   No murmur heard. Pulmonary/Chest: Effort normal and breath sounds normal. No respiratory distress. She has no wheezes. She exhibits no tenderness.  Abdominal: Soft. Bowel sounds are normal. She exhibits no distension and no mass. There is no tenderness. There is no rebound and no guarding.  Musculoskeletal: Normal range of motion. She exhibits no edema or tenderness.  Lymphadenopathy:    She has no cervical adenopathy.  Neurological: She is alert and oriented to person, place, and time. No cranial nerve deficit.  She exhibits normal muscle tone.  Skin: Skin is warm and dry. No rash noted. No erythema. No pallor.  Right groin incision site appears to be well healing No tenderness to palpation No signs of infection  Nursing note and vitals reviewed.  ED Treatments / Results  DIAGNOSTIC STUDIES: Oxygen Saturation is 100% on RA, normal by my interpretation.    COORDINATION OF  CARE: 1:00 AM Discussed treatment plan with pt at bedside which includes CT scan and pt agreed to plan.  Labs (all labs ordered are listed, but only abnormal results are displayed) Labs Reviewed - No data to display  EKG  EKG Interpretation None      Radiology No results found.  Procedures Procedures (including critical care time)  Medications Ordered in ED Medications - No data to display   Initial Impression / Assessment and Plan / ED Course  I have reviewed the triage vital signs and the nursing notes.  Pertinent labs & imaging results that were available during my care of the patient were reviewed by me and considered in my medical decision making (see chart for details).  Clinical Course  Patient presents to the ED for abdominal pain and weakness.  She is anemic and was transfused in the ED.  CTA does not show any active bleed.  I spoke with Dr. Scot Dock with vasc who recs for medical admission.  Hemoccult is negative.  Patient will need step down for further care.  CRITICAL CARE Performed by: Everlene Balls   Total critical care time: 60 minutes - symptomatic anemia  Critical care time was exclusive of separately billable procedures and treating other patients.  Critical care was necessary to treat or prevent imminent or life-threatening deterioration.  Critical care was time spent personally by me on the following activities: development of treatment plan with patient and/or surrogate as well as nursing, discussions with consultants, evaluation of patient's response to treatment, examination of patient, obtaining history from patient or surrogate, ordering and performing treatments and interventions, ordering and review of laboratory studies, ordering and review of radiographic studies, pulse oximetry and re-evaluation of patient's condition.     Angiocath insertion Performed by: Everlene Balls  Consent: Verbal consent obtained. Risks and benefits: risks, benefits and  alternatives were discussed Time out: Immediately prior to procedure a "time out" was called to verify the correct patient, procedure, equipment, support staff and site/side marked as required.  Preparation: Patient was prepped and draped in the usual sterile fashion.  Vein Location: R brachial vein  Ultrasound Guided  Gauge: 20G  Normal blood return and flush without difficulty Patient tolerance: Patient tolerated the procedure well with no immediate complications.  Angiocath insertion Performed by: Everlene Balls  Consent: Verbal consent obtained. Risks and benefits: risks, benefits and alternatives were discussed Time out: Immediately prior to procedure a "time out" was called to verify the correct patient, procedure, equipment, support staff and site/side marked as required.  Preparation: Patient was prepped and draped in the usual sterile fashion.  Vein Location: L brachial vein  Ultrasound Guided  Gauge: 20G  Normal blood return and flush without difficulty Patient tolerance: Patient tolerated the procedure well with no immediate complications.      I personally performed the services described in this documentation, which was scribed in my presence. The recorded information has been reviewed and is accurate.    Final Clinical Impressions(s) / ED Diagnoses   Final diagnoses:  None    New Prescriptions New Prescriptions   No medications on file  Everlene Balls, MD 04/24/16 [phone]

## 2016-04-23 NOTE — ED Notes (Signed)
IV flushed multiple times, no resistance, no swelling noted, per pt's request transfusion restarted, Dr Claudine Mouton at bedside to evaluate IV site and update pt.

## 2016-04-23 NOTE — ED Triage Notes (Signed)
Pt is from [place] and recently had surgery to have a stent placed in her mesenteric artery at the end of Sept. She was also admitted after that for pain and wound dehiscence, she has been here one week and has been ok until today she started to feel weak, started vomiting and has had several bowel movements.

## 2016-04-23 NOTE — ED Notes (Signed)
Admitting MD at bedside.

## 2016-04-23 NOTE — H&P (Deleted)
[facility] Update Note  Called to bedside about GI bleed. Consulted GI who will see patient. Examined paitent alert and oriented, no neurological deficits, Ultram these well perfused, no abdominal pain, normal bowel sounds, on rectal exam there is frank blood but no other abnormalities. Regular rate and rhythm, no murmurs or gallops. Lungs clear to all fixation bilaterally.  A mix of bright red blood and melena in the commode in the patient's room.  Repeat CBC has been ordered. Patient to get type and screened.  Will call Vascular surgery.  Elwin Mocha MD

## 2016-04-23 NOTE — Progress Notes (Signed)
Initial Nutrition Assessment  DOCUMENTATION CODES:   Not applicable  INTERVENTION:  Discontinue Ensure.  Provide Boost Breeze po TID, each supplement provides 250 kcal and 9 grams of protein  Encourage adequate PO intake.   NUTRITION DIAGNOSIS:   Increased nutrient needs related to wound healing as evidenced by estimated needs.  GOAL:   Patient will meet greater than or equal to 90% of their needs  MONITOR:   PO intake, Supplement acceptance, Labs, Weight trends, Skin, I & O's  REASON FOR ASSESSMENT:   Malnutrition Screening Tool    ASSESSMENT:   45 y.o. 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  She presents today for weakness that started this morning. She states that she does not have any pain. She has had 2 episodes of diarrhea this morning and almost passed out while on the commode. She states she felt hot, broke out in a sweat. She takes Miralax for constipation.  She is mildly nauseated now but has not had any vomiting. No abdominal pain, fever or chills.  Noted to have Trichomonas UTI - does not admit to urinary symptoms  Pt reports appetite has been improving. No recent meal completion recorded, however pt reports consuming all of her chicken salad on her lunch tray. Pt reports she has been eating well at home with usual intake of at least 3 meals a day. Pt does report a 15-20 lb weight loss since September 2017. Pt with a 9% reported weight loss in 1 month. Pt currently has ensure ordered and has been refusing them due to disliking the taste. Pt is agreeable to Boost Breeze instead. RD to order. Pt encouraged to eat her food at meals. Noted per MD note, a mix of bright red blood and melena in the commode in the patient's room. GI to see pt.   Pt with no observed significant fat or muscle mass loss.  Labs and medications reviewed.   Diet Order:  Diet Heart Room service appropriate? Yes; Fluid consistency:  Thin  Skin:  Wound (see comment) (Incision on R groin, Right inguinal nonhealing surgical wound)  Last BM:  Unknown  Height:   Ht Readings from Last 1 Encounters:  04/23/16 5\' 4"  (1.626 m)    Weight:   Wt Readings from Last 1 Encounters:  04/23/16 150 lb 9.2 oz (68.3 kg)    Ideal Body Weight:  54.5 kg  BMI:  Body mass index is 25.85 kg/m.  Estimated Nutritional Needs:   Kcal:  1850-2000  Protein:  85-95 grams  Fluid:  1.8 - 2 L/day  EDUCATION NEEDS:   No education needs identified at this time  Corrin Parker, MS, RD, LDN Pager # [phone] After hours/ weekend pager # [phone]

## 2016-04-24 ENCOUNTER — Inpatient Hospital Stay (HOSPITAL_COMMUNITY): Payer: Medicaid Other

## 2016-04-24 DIAGNOSIS — K922 Gastrointestinal hemorrhage, unspecified: Secondary | ICD-10-CM

## 2016-04-24 LAB — CBC
HEMATOCRIT: 26.8 % — AB (ref 36.0–46.0)
HEMOGLOBIN: 9.1 g/dL — AB (ref 12.0–15.0)
MCH: 31.4 pg (ref 26.0–34.0)
MCHC: 34 g/dL (ref 30.0–36.0)
MCV: 92.4 fL (ref 78.0–100.0)
Platelets: 279 10*3/uL (ref 150–400)
RBC: 2.9 MIL/uL — AB (ref 3.87–5.11)
RDW: 19.3 % — AB (ref 11.5–15.5)
WBC: 11.3 10*3/uL — AB (ref 4.0–10.5)

## 2016-04-24 LAB — BASIC METABOLIC PANEL
Anion gap: 7 (ref 5–15)
BUN: 9 mg/dL (ref 6–20)
CHLORIDE: 105 mmol/L (ref 101–111)
CO2: 24 mmol/L (ref 22–32)
Calcium: 8.8 mg/dL — ABNORMAL LOW (ref 8.9–10.3)
Creatinine, Ser: 0.58 mg/dL (ref 0.44–1.00)
GFR calc Af Amer: >60 mL/min (ref >60)
GFR calc non Af Amer: >60 mL/min (ref >60)
Glucose, Bld: 119 mg/dL — ABNORMAL HIGH (ref 65–99)
POTASSIUM: 3.6 mmol/L (ref 3.5–5.1)
SODIUM: 136 mmol/L (ref 135–145)

## 2016-04-24 LAB — HEMOGLOBIN AND HEMATOCRIT, BLOOD
HCT: 24.5 % — ABNORMAL LOW (ref 36.0–46.0)
Hemoglobin: 8.3 g/dL — ABNORMAL LOW (ref 12.0–15.0)

## 2016-04-24 MED ORDER — SODIUM CHLORIDE 0.9% FLUSH
10.0000 mL | INTRAVENOUS | Status: DC | PRN
  Administered 2016-04-27: 10 mL
  Filled 2016-04-24: qty 40

## 2016-04-24 MED ORDER — SODIUM CHLORIDE 0.9% FLUSH
10.0000 mL | Freq: Two times a day (BID) | INTRAVENOUS | Status: DC
  Administered 2016-04-24 – 2016-04-26 (×5): 10 mL

## 2016-04-24 MED ORDER — PEG-KCL-NACL-NASULF-NA ASC-C 100 G PO SOLR
1.0000 | Freq: Once | ORAL | Status: AC
  Administered 2016-04-24: 200 g via ORAL
  Filled 2016-04-24: qty 1

## 2016-04-24 MED ORDER — DEXTROSE 5 % IV SOLN
500.0000 mg | Freq: Three times a day (TID) | INTRAVENOUS | Status: DC | PRN
  Administered 2016-04-24 – 2016-04-25 (×2): 500 mg via INTRAVENOUS
  Filled 2016-04-24 (×4): qty 5

## 2016-04-24 MED ORDER — DEXTROSE 5 % IV SOLN
1.0000 g | INTRAVENOUS | Status: DC
  Administered 2016-04-24 – 2016-04-27 (×4): 1 g via INTRAVENOUS
  Filled 2016-04-24 (×7): qty 10

## 2016-04-24 NOTE — Progress Notes (Signed)
[facility] Progress Note Patient Name: Michelle Joyce DOB: 1971-03-28 MRN: [id number]   Date of Service  04/24/2016  HPI/Events of Note  Urine culture from 04/23/2016 is positive for GNR's >100K colonies/mL.  eICU Interventions  Will order: 1. Rocephin per pharmacy consult/     Intervention Category Major Interventions: Infection - evaluation and management  [name] 04/24/2016, 7:10 PM

## 2016-04-24 NOTE — Progress Notes (Signed)
[facility] PROGRESS NOTE  Michelle Joyce FHQ:[id number] DOB: [birth year] DOA: 04/23/2016   PCP: No PCP Per Patient     Assessment/Plan: Principal Problem:   Dissection of right iliac artery (HCC) Active Problems:   Acute blood loss anemia   Hyponatremia   Lactic acidosis   Gastrointestinal hemorrhage   45 y.o. female from [place] who presented to ED around midnight with weakness and vomiting. Patient was in good health until late September when she was hospitalized three times. Patient presented to [facility] in [place] in September with abdominal pain, she underwent SMA stenting for stenosis. Started on plavix post-procedure.  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 Following that she developed a right groin hematoma (surgically evacuated ) and a retroperitoneal hematoma. Following all this patient was admitted a third time with constipation  / impaction. After bowel purge patient felt much better. She did require blood transfusion during two of the recent admissions. Patient had not passed any blood, stools were dark but not black. There was no mention of GI bleeding during any of those admission.  Yesterday she became extremely weak with nausea and loose nonbloody stool. She reports 2 syncopal episodes at home. Patient went to [facility] ED earlier this am.  Hgb 6-7. CTA showed hematoma extending from right inguinal and right iliac vessels into the pelvis, likely subacute, patient transferred to [facility] in case surgical intervention was needed. Upon arriving to [facility] patient had a large dark bloody BM  Assessment and plan    painless hematochezia on plavix and ASA. Hgb down a couple of grams since Oct 8th. 8.2 >>>> 6.8  , now 8.3 after 2 units.   In toilet there is a large amount of dark red liquid which just started 10/20 Plavix on hold Ruled out for upper GI bleeding yesterday by GI tagged RBC studies  negative Prepping for colonoscopy today      Anemia of acute blood loss. Received several units of blood in [place] in Sept following vascular procedures complicated by right groin and retroperitoneal hematoma. Hgb low 8 range upon hospital discharge in [place] around Oct 8th. Presented with a hemoglobin of 6.7 ( Transfused 2 units of blood, follow up hgb only 6.8.     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 Following this she developed a right groin hematoma (requring surgical evacuation) and a right retroperitoneal bleed).  Presented to [facility] ED today with weakness, CTA suggested dissection of right external iliac artery. Vascular Surgery recommends conservative measures for now, to continue antiplatelet therapy, which is limited by her GI bleeding issues.  Vascular surgery following If she required IR embolization, it would be beneficial to evaluate her right iliac and femoral arteries  Trichomonas UTI, leukocytosis - Flagyl 2 gm once given in ER -Check for chlamydia/gonorrhea, RPR, HIV  Wound right groin - as a result of procedures - cont dressing per patient- will also consult wound care  H/o HTN - cont Amlodipine and Toprol (12.5 mg daily)  Complaint of always being tachycardic-related to her GI bleeding/anemia TSH 0.7   Mid right back pain  Due to 8.2 x8.2 cm loculated hematoma extending from the right inguinal and right iliac vessels into the pelvis above the urinary bladder,likely subacute  DVT prophylaxsis SCDs  Code Status:  Full code    Family Communication: Discussed  in detail with the patient, all imaging results, lab results explained to the patient   Disposition Plan:  3-4 days      Consultants:  GI  Vascular surgery  Procedures:  None  Antibiotics: Anti-infectives    Start     Dose/Rate Route Frequency Ordered  Stop   04/23/16 0900  metroNIDAZOLE (FLAGYL) IVPB 2 g  Status:  Discontinued     2 g 400 mL/hr over 60 Minutes Intravenous  Once 04/23/16 0854 04/23/16 1033   04/23/16 0645  cefTRIAXone (ROCEPHIN) 1 g in dextrose 5 % 50 mL IVPB  Status:  Discontinued     1 g 100 mL/hr over 30 Minutes Intravenous  Once 04/23/16 0640 04/23/16 0856   04/23/16 0515  metroNIDAZOLE (FLAGYL) tablet 2,000 mg     2,000 mg Oral  Once 04/23/16 0510 04/23/16 0725         HPI/Subjective: Patient states that she had one episode of bloody stool this morning  Objective: Vitals:   04/23/16 1954 04/24/16 0030 04/24/16 0349 04/24/16 0901  BP: 136/84 (!) 146/97 (!) 158/90 (!) 164/105  Pulse: 97 (!) 107 84 83  Resp: 18 20 (!) 21 20  Temp: 98.6 F (37 C) 98.4 F (36.9 C) 98.1 F (36.7 C) 98 F (36.7 C)  TempSrc: Oral Oral Oral Oral  SpO2: 98% 100% 100% 100%  Weight:      Height:        Intake/Output Summary (Last 24 hours) at 04/24/16 0926 Last data filed at 04/24/16 0600  Gross per 24 hour  Intake          1466.25 ml  Output                0 ml  Net          1466.25 ml    Exam:  Examination:  General exam: Appears calm and comfortable  Respiratory system: Clear to auscultation. Respiratory effort normal. Cardiovascular system: S1 & S2 heard, RRR. No JVD, murmurs, rubs, gallops or clicks. No pedal edema. Gastrointestinal system: Abdomen is nondistended, soft and nontender. No organomegaly or masses felt. Normal bowel sounds heard. Central nervous system: Alert and oriented. No focal neurological deficits. Extremities: Symmetric 5 x 5 power. Skin: No rashes, lesions or ulcers Psychiatry: Judgement and insight appear normal. Mood & affect appropriate.     Data Reviewed: I have personally reviewed following labs and imaging studies  Micro Results Recent Results (from the past 240 hour(s))  MRSA PCR Screening     Status: None   Collection Time: 04/23/16 10:39 AM  Result Value Ref Range Status    MRSA by PCR NEGATIVE NEGATIVE Final    Comment:        The GeneXpert MRSA Assay (FDA approved for NASAL specimens only), is one component of a comprehensive MRSA colonization surveillance program. It is not intended to diagnose MRSA infection nor to guide or monitor treatment for MRSA infections.     Radiology Reports Nm Gi Blood Loss  Result Date: 04/24/2016 CLINICAL DATA:  45 year old female with GI bleed. EXAM: NUCLEAR MEDICINE GASTROINTESTINAL BLEEDING SCAN TECHNIQUE: Sequential abdominal images were obtained following intravenous administration of Tc-47mlabeled red blood cells. RADIOPHARMACEUTICALS:  26.4 mCi Tc-959mn-vitro labeled red cells. COMPARISON:  Abdominal CT dated 04/23/2016 FINDINGS: Cardiac blood pool as well as activity within the aorta and iliac vessels noted. There is a physiologic uptake in the liver. There is excretion of radiotracer into the bladder. No definite activity identified conforming to  the bowel to suggest active GI bleed. IMPRESSION: No definite evidence of active GI bleed. Electronically Signed   By: Anner Crete M.D.   On: 04/24/2016 01:11   Ct Angio Abd/pel W And/or Wo Contrast  Result Date: 04/23/2016 CLINICAL DATA:  45 year old female with 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 Patient presenting with weakness and abdominal pain. EXAM: CTA ABDOMEN AND PELVIS wITHOUT AND WITH CONTRAST TECHNIQUE: Multidetector CT imaging of the abdomen and pelvis was performed using the standard protocol during bolus administration of intravenous contrast. Multiplanar reconstructed images and MIPs were obtained and reviewed to evaluate the vascular anatomy. CONTRAST:  100 cc Isovue 370 COMPARISON:  None. FINDINGS: Evaluation is limited due to respiratory motion artifact. VASCULAR Aorta: There is diffuse predominantly noncalcified plaque along the course of the abdominal aorta. No aneurysmal dilatation or evidence of dissection. There is partial  occlusion of the right external iliac artery with a double-lumen appearance of the distal portion of the right external iliac artery concerning for dissection, possibly iatrogenic. Evaluation of these vessels however very limited due to motion artifact. There is also partial occlusion of the right common femoral artery. There is a 8.2 x 8.2 cm walled-off high attenuating fluid collection extending from the right inguinal and right iliac area into the pelvis above the bladder most compatible with a hematoma, possibly subacute. No definite extravasation of contrast noted within this collection to suggest active bleed. Superimposed infection is not excluded. Celiac: The origin of the celiac axis appears patent. There is an accessory left hepatic artery arising from the left gastric artery. SMA: A SMA stent is noted and appears patent. Evaluation of the stent however is limited due to respiratory motion artifact. No definite extravasation of contrast or teres stent infiltration identified. The distal SMA appears patent. Renals: Both renal arteries are patent without evidence of aneurysm, dissection, vasculitis, fibromuscular dysplasia or significant stenosis. IMA: Appears unremarkable as visualized. Veins: The IVC is grossly unremarkable. The SMV, splenic vein, and main portal vein are patent. No portal venous gas identified. Review of the MIP images confirms the above findings. NON-VASCULAR Lower chest: The visualized lung bases are clear. No intra-abdominal free air or free fluid Hepatobiliary: The liver appears unremarkable. No intrahepatic biliary ductal dilatation. Vicarious excretion of contrast into the gallbladder noted. Pancreas: Unremarkable. No pancreatic ductal dilatation or surrounding inflammatory changes. Spleen: Normal in size without focal abnormality. Adrenals/Urinary Tract: Ill-defined 1.4 cm nodularity of the left adrenal gland, indeterminate. The right adrenal gland appears unremarkable. The  kidneys, visualized ureters appear unremarkable. There is diffuse thickened appearance of the bladder wall which may be secondary to inflammatory changes and hematoma within the pelvis. Correlation with urinalysis recommended to evaluate for cystitis. Stomach/Bowel: Multiple nondistended fluid filled loops of small bowel noted throughout the abdomen. Loose stool noted within the colon compatible with diarrheal state. There is no evidence of bowel obstruction. Lymphatic: No adenopathy. Reproductive: The uterus is grossly unremarkable. Other: Right inguinal subcutaneous stranding/scarring, likely related to femoral access. There is a small supraumbilical fat containing hernia. Musculoskeletal: No acute or significant osseous findings. IMPRESSION: VASCULAR No evidence of aortic aneurysm or dissection. SMA stent appears unremarkable and patent. Double-lumen appearance of the right external iliac artery concerning for dissection, possibly iatrogenic. There is an 8.2 x 8.2 cm loculated hematoma extending from the right inguinal and right iliac vessels into the pelvis above the urinary bladder, likely subacute. No definite evidence of active bleed. Evaluation of the vasculature is limited due to  motion artifact. NON-VASCULAR No acute intra-abdominal pelvic pathology. **An incidental finding of potential clinical significance has been found. A 1.4 cm probable left adrenal nodule, indeterminate. Nonemergent MRI without and with contrast is recommended for further evaluation.** Thickened appearance of the bladder wall, possibly reactive to the hematoma within the pelvis. Correlation with urinalysis recommended to exclude cystitis. These results were called by telephone at the time of interpretation on 04/23/2016 at 6:35 am to Dr. Everlene Balls , who verbally acknowledged these results. Electronically Signed   By: Anner Crete M.D.   On: 04/23/2016 06:36     CBC  Recent Labs Lab 04/23/16 0111 04/23/16 0253  04/23/16 1829 04/24/16 0116  WBC 16.1*  --  12.8*  --   HGB 6.7* 6.8* 8.3* 8.3*  HCT 20.1* 20.0* 24.4* 24.5*  PLT 400  --  252  --   MCV 98.5  --  92.1  --   MCH 32.8  --  31.3  --   MCHC 33.3  --  34.0  --   RDW 19.1*  --  19.4*  --   LYMPHSABS 2.8  --  2.2  --   MONOABS 0.9  --  0.9  --   EOSABS 0.0  --  0.1  --   BASOSABS 0.0  --  0.0  --     Chemistries   Recent Labs Lab 04/23/16 0111 04/23/16 0253 04/23/16 1829 04/24/16 0116  NA 133* 134* 134* 136  K 4.3 3.6 3.5 3.6  CL 101 97* 104 105  CO2 21*  --  24 24  GLUCOSE 125* 117* 107* 119*  BUN 21* 20 13 9   CREATININE 0.75 0.60 0.71 0.58  CALCIUM 9.1  --  8.6* 8.8*  MG 1.8  --   --   --   AST 34  --   --   --   ALT 15  --   --   --   ALKPHOS 61  --   --   --   BILITOT 0.8  --   --   --    ------------------------------------------------------------------------------------------------------------------ estimated creatinine clearance is 85.1 mL/min (by C-G formula based on SCr of 0.58 mg/dL). ------------------------------------------------------------------------------------------------------------------ No results for input(s): HGBA1C in the last 72 hours. ------------------------------------------------------------------------------------------------------------------ No results for input(s): CHOL, HDL, LDLCALC, TRIG, CHOLHDL, LDLDIRECT in the last 72 hours. ------------------------------------------------------------------------------------------------------------------  Recent Labs  04/23/16 0941  TSH 0.725   ------------------------------------------------------------------------------------------------------------------ No results for input(s): VITAMINB12, FOLATE, FERRITIN, TIBC, IRON, RETICCTPCT in the last 72 hours.  Coagulation profile  Recent Labs Lab 04/23/16 0111 04/23/16 1829  INR 1.05 1.14    No results for input(s): DDIMER in the last 72 hours.  Cardiac Enzymes No results for input(s):  CKMB, TROPONINI, MYOGLOBIN in the last 168 hours.  Invalid input(s): CK ------------------------------------------------------------------------------------------------------------------ Invalid input(s): POCBNP   CBG: No results for input(s): GLUCAP in the last 168 hours.     Studies: Nm Gi Blood Loss  Result Date: 04/24/2016 CLINICAL DATA:  45 year old female with GI bleed. EXAM: NUCLEAR MEDICINE GASTROINTESTINAL BLEEDING SCAN TECHNIQUE: Sequential abdominal images were obtained following intravenous administration of Tc-7mlabeled red blood cells. RADIOPHARMACEUTICALS:  26.4 mCi Tc-948mn-vitro labeled red cells. COMPARISON:  Abdominal CT dated 04/23/2016 FINDINGS: Cardiac blood pool as well as activity within the aorta and iliac vessels noted. There is a physiologic uptake in the liver. There is excretion of radiotracer into the bladder. No definite activity identified conforming to the bowel to suggest active GI bleed. IMPRESSION: No definite evidence  of active GI bleed. Electronically Signed   By: Anner Crete M.D.   On: 04/24/2016 01:11   Ct Angio Abd/pel W And/or Wo Contrast  Result Date: 04/23/2016 CLINICAL DATA:  45 year old female with 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 Patient presenting with weakness and abdominal pain. EXAM: CTA ABDOMEN AND PELVIS wITHOUT AND WITH CONTRAST TECHNIQUE: Multidetector CT imaging of the abdomen and pelvis was performed using the standard protocol during bolus administration of intravenous contrast. Multiplanar reconstructed images and MIPs were obtained and reviewed to evaluate the vascular anatomy. CONTRAST:  100 cc Isovue 370 COMPARISON:  None. FINDINGS: Evaluation is limited due to respiratory motion artifact. VASCULAR Aorta: There is diffuse predominantly noncalcified plaque along the course of the abdominal aorta. No aneurysmal dilatation or evidence of dissection. There is partial occlusion of the right external  iliac artery with a double-lumen appearance of the distal portion of the right external iliac artery concerning for dissection, possibly iatrogenic. Evaluation of these vessels however very limited due to motion artifact. There is also partial occlusion of the right common femoral artery. There is a 8.2 x 8.2 cm walled-off high attenuating fluid collection extending from the right inguinal and right iliac area into the pelvis above the bladder most compatible with a hematoma, possibly subacute. No definite extravasation of contrast noted within this collection to suggest active bleed. Superimposed infection is not excluded. Celiac: The origin of the celiac axis appears patent. There is an accessory left hepatic artery arising from the left gastric artery. SMA: A SMA stent is noted and appears patent. Evaluation of the stent however is limited due to respiratory motion artifact. No definite extravasation of contrast or teres stent infiltration identified. The distal SMA appears patent. Renals: Both renal arteries are patent without evidence of aneurysm, dissection, vasculitis, fibromuscular dysplasia or significant stenosis. IMA: Appears unremarkable as visualized. Veins: The IVC is grossly unremarkable. The SMV, splenic vein, and main portal vein are patent. No portal venous gas identified. Review of the MIP images confirms the above findings. NON-VASCULAR Lower chest: The visualized lung bases are clear. No intra-abdominal free air or free fluid Hepatobiliary: The liver appears unremarkable. No intrahepatic biliary ductal dilatation. Vicarious excretion of contrast into the gallbladder noted. Pancreas: Unremarkable. No pancreatic ductal dilatation or surrounding inflammatory changes. Spleen: Normal in size without focal abnormality. Adrenals/Urinary Tract: Ill-defined 1.4 cm nodularity of the left adrenal gland, indeterminate. The right adrenal gland appears unremarkable. The kidneys, visualized ureters appear  unremarkable. There is diffuse thickened appearance of the bladder wall which may be secondary to inflammatory changes and hematoma within the pelvis. Correlation with urinalysis recommended to evaluate for cystitis. Stomach/Bowel: Multiple nondistended fluid filled loops of small bowel noted throughout the abdomen. Loose stool noted within the colon compatible with diarrheal state. There is no evidence of bowel obstruction. Lymphatic: No adenopathy. Reproductive: The uterus is grossly unremarkable. Other: Right inguinal subcutaneous stranding/scarring, likely related to femoral access. There is a small supraumbilical fat containing hernia. Musculoskeletal: No acute or significant osseous findings. IMPRESSION: VASCULAR No evidence of aortic aneurysm or dissection. SMA stent appears unremarkable and patent. Double-lumen appearance of the right external iliac artery concerning for dissection, possibly iatrogenic. There is an 8.2 x 8.2 cm loculated hematoma extending from the right inguinal and right iliac vessels into the pelvis above the urinary bladder, likely subacute. No definite evidence of active bleed. Evaluation of the vasculature is limited due to motion artifact. NON-VASCULAR No acute intra-abdominal pelvic pathology. **An incidental finding  of potential clinical significance has been found. A 1.4 cm probable left adrenal nodule, indeterminate. Nonemergent MRI without and with contrast is recommended for further evaluation.** Thickened appearance of the bladder wall, possibly reactive to the hematoma within the pelvis. Correlation with urinalysis recommended to exclude cystitis. These results were called by telephone at the time of interpretation on 04/23/2016 at 6:35 am to Dr. Everlene Balls , who verbally acknowledged these results. Electronically Signed   By: Anner Crete M.D.   On: 04/23/2016 06:36      No results found for: HGBA1C Lab Results  Component Value Date   CREATININE 0.58 04/24/2016        Scheduled Meds: . sodium chloride  10 mL/hr Intravenous Once  . amLODipine  10 mg Oral Daily  . feeding supplement  1 Container Oral TID BM  . metoprolol tartrate  12.5 mg Oral BID  . peg 3350 powder  1 kit Oral Once  . sodium chloride flush  10-40 mL Intracatheter Q12H   Continuous Infusions: . sodium chloride 100 mL/hr at 04/24/16 0652  . dextrose 5 % and 0.45% NaCl Stopped (04/23/16 2111)     LOS: 1 day    Time spent: >30 MINS    [facility] Pager [phone]. If 7PM-7AM, please contact night-coverage at [email or web address], password [facility] 04/24/2016, 9:26 AM  LOS: 1 day

## 2016-04-24 NOTE — Progress Notes (Signed)
Peripherally Inserted Central Catheter/Midline Placement  The IV Nurse has discussed with the patient and/or persons authorized to consent for the patient, the purpose of this procedure and the potential benefits and risks involved with this procedure.  The benefits include less needle sticks, lab draws from the catheter, and the patient may be discharged home with the catheter. Risks include, but not limited to, infection, bleeding, blood clot (thrombus formation), and puncture of an artery; nerve damage and irregular heartbeat and possibility to perform a PICC exchange if needed/ordered by physician.  Alternatives to this procedure were also discussed.  Bard Power PICC patient education guide, fact sheet on infection prevention and patient information card has been provided to patient /or left at bedside.    PICC/Midline Placement Documentation        Michelle Joyce 04/24/2016, 8:42 AM

## 2016-04-24 NOTE — Progress Notes (Signed)
     [facility] Progress Note  Subjective: Mid right back pain. Another bloody stool this am .   Objective:  Vital signs in last 24 hours: Temp:  [98.1 F (36.7 C)-99.2 F (37.3 C)] 98.1 F (36.7 C) (10/21 0349) Pulse Rate:  [84-115] 84 (10/21 0349) Resp:  [18-24] 21 (10/21 0349) BP: (128-168)/(60-102) 158/90 (10/21 0349) SpO2:  [98 %-100 %] 100 % (10/21 0349) Weight:  [150 lb 9.2 oz (68.3 kg)] 150 lb 9.2 oz (68.3 kg) (10/20 0915) Last BM Date: 04/23/16 General:   Alert, well-developed, black female in NAD EENT:  Normal hearing, non icteric sclera, conjunctive pink.  Heart:  Regular rate and rhythm; no murmurs.  Pulm: Normal respiratory effort, lungs CTA bilaterally without wheezes or crackles. Abdomen:  Soft, nondistended, nontender.  Normal bowel sounds, no masses felt.  Neurologic:  Alert and  oriented x4;  grossly normal neurologically. Psych:  Alert and cooperative. Normal mood and affect.   Intake/Output from previous day: 10/20 0701 - 10/21 0700 In: 1466.3 [I.V.:1131.3; Blood:335] Out: -  Intake/Output this shift: No intake/output data recorded.  Lab Results:  Recent Labs  04/23/16 0111 04/23/16 0253 04/23/16 1829 04/24/16 0116  WBC 16.1*  --  12.8*  --   HGB 6.7* 6.8* 8.3* 8.3*  HCT 20.1* 20.0* 24.4* 24.5*  PLT 400  --  252  --    BMET  Recent Labs  04/23/16 0111 04/23/16 0253 04/23/16 1829 04/24/16 0116  NA 133* 134* 134* 136  K 4.3 3.6 3.5 3.6  CL 101 97* 104 105  CO2 21*  --  24 24  GLUCOSE 125* 117* 107* 119*  BUN 21* 20 13 9   CREATININE 0.75 0.60 0.71 0.58  CALCIUM 9.1  --  8.6* 8.8*    Nm Gi Blood Loss  Result Date: 04/24/2016 CLINICAL DATA:  45 year old female with GI bleed. EXAM: NUCLEAR MEDICINE GASTROINTESTINAL BLEEDING SCAN TECHNIQUE: Sequential abdominal images were obtained following intravenous administration of Tc-24m labeled red blood cells. RADIOPHARMACEUTICALS:  26.4 mCi Tc-56m in-vitro labeled red cells.  COMPARISON:  Abdominal CT dated 04/23/2016 FINDINGS: Cardiac blood pool as well as activity within the aorta and iliac vessels noted. There is a physiologic uptake in the liver. There is excretion of radiotracer into the bladder. No definite activity identified conforming to the bowel to suggest active GI bleed. IMPRESSION: No definite evidence of active GI bleed. Electronically Signed   By: Anner Crete M.D.   On: 04/24/2016 01:91     21. 45 year old female with painless hematochezia on plavix and ASA. Hgb down a couple of grams since Oct 8th. 8.2 >>>> 6.7 today. Received two units of blood yesterday. Initially hgb did rise at later check it went up to 8.3 and has been stable despite ongoing bleeding. RBC scan was negative. Patient had another bloody stool around 7am. Will prep now for colonoscopy to be done later today. The risks and benefits of the procedure were discussed and the patient agrees to proceed.   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 Vascular team has seen, doubtful there is a fistula to arterial system.  3. Right mid back pain. 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 Right mid back adipose tissue > than left. She is tender to palpation there  . Tye Savoy  04/24/2016, 8:57 AM  Pager number [phone]

## 2016-04-25 ENCOUNTER — Encounter (HOSPITAL_COMMUNITY)
Admission: EM | Disposition: A | Discharge status: Home / Self Care | Payer: Self-pay | Source: Home / Self Care | Attending: Internal Medicine

## 2016-04-25 ENCOUNTER — Encounter (HOSPITAL_COMMUNITY): Payer: Self-pay

## 2016-04-25 ENCOUNTER — Inpatient Hospital Stay (HOSPITAL_COMMUNITY): Payer: Medicaid Other

## 2016-04-25 DIAGNOSIS — K648 Other hemorrhoids: Secondary | ICD-10-CM

## 2016-04-25 DIAGNOSIS — K269 Duodenal ulcer, unspecified as acute or chronic, without hemorrhage or perforation: Secondary | ICD-10-CM

## 2016-04-25 HISTORY — PX: ESOPHAGOGASTRODUODENOSCOPY: SHX5428

## 2016-04-25 HISTORY — PX: COLONOSCOPY: SHX5424

## 2016-04-25 LAB — URINE CULTURE: Culture: >=100000 — AB

## 2016-04-25 LAB — COMPREHENSIVE METABOLIC PANEL
ALK PHOS: 63 U/L (ref 38–126)
ALT: 13 U/L — AB (ref 14–54)
AST: 18 U/L (ref 15–41)
Albumin: 3.6 g/dL (ref 3.5–5.0)
Anion gap: 8 (ref 5–15)
CALCIUM: 9.3 mg/dL (ref 8.9–10.3)
CHLORIDE: 104 mmol/L (ref 101–111)
CO2: 24 mmol/L (ref 22–32)
CREATININE: 0.55 mg/dL (ref 0.44–1.00)
GFR calc Af Amer: >60 mL/min (ref >60)
GFR calc non Af Amer: >60 mL/min (ref >60)
Glucose, Bld: 106 mg/dL — ABNORMAL HIGH (ref 65–99)
Potassium: 3.5 mmol/L (ref 3.5–5.1)
SODIUM: 136 mmol/L (ref 135–145)
Total Bilirubin: 0.8 mg/dL (ref 0.3–1.2)
Total Protein: 6.5 g/dL (ref 6.5–8.1)

## 2016-04-25 LAB — CBC
HCT: 26.9 % — ABNORMAL LOW (ref 36.0–46.0)
HEMOGLOBIN: 8.9 g/dL — AB (ref 12.0–15.0)
MCH: 31.1 pg (ref 26.0–34.0)
MCHC: 33.1 g/dL (ref 30.0–36.0)
MCV: 94.1 fL (ref 78.0–100.0)
PLATELETS: 283 10*3/uL (ref 150–400)
RBC: 2.86 MIL/uL — AB (ref 3.87–5.11)
RDW: 19 % — ABNORMAL HIGH (ref 11.5–15.5)
WBC: 10.3 10*3/uL (ref 4.0–10.5)

## 2016-04-25 SURGERY — COLONOSCOPY
Anesthesia: Moderate Sedation

## 2016-04-25 MED ORDER — HYDROMORPHONE HCL 1 MG/ML IJ SOLN
2.0000 mg | Freq: Once | INTRAMUSCULAR | Status: DC
  Filled 2016-04-25: qty 2

## 2016-04-25 MED ORDER — FENTANYL CITRATE (PF) 100 MCG/2ML IJ SOLN
INTRAMUSCULAR | Status: DC | PRN
  Administered 2016-04-25 (×6): 25 ug via INTRAVENOUS

## 2016-04-25 MED ORDER — MIDAZOLAM HCL 5 MG/ML IJ SOLN
INTRAMUSCULAR | Status: AC
  Filled 2016-04-25: qty 3

## 2016-04-25 MED ORDER — DIPHENHYDRAMINE HCL 50 MG/ML IJ SOLN
INTRAMUSCULAR | Status: AC
  Filled 2016-04-25: qty 1

## 2016-04-25 MED ORDER — DIPHENHYDRAMINE HCL 50 MG/ML IJ SOLN
INTRAMUSCULAR | Status: DC | PRN
  Administered 2016-04-25 (×2): 25 mg via INTRAVENOUS

## 2016-04-25 MED ORDER — PANTOPRAZOLE SODIUM 40 MG IV SOLR: 40.0000 mg | Freq: Every day | INTRAVENOUS | Status: DC

## 2016-04-25 MED ORDER — FENTANYL CITRATE (PF) 100 MCG/2ML IJ SOLN
INTRAMUSCULAR | Status: AC
  Filled 2016-04-25: qty 4

## 2016-04-25 MED ORDER — METHOCARBAMOL 1000 MG/10ML IJ SOLN
500.0000 mg | Freq: Three times a day (TID) | INTRAVENOUS | Status: DC | PRN
  Administered 2016-04-25: 500 mg via INTRAVENOUS
  Filled 2016-04-25 (×2): qty 5

## 2016-04-25 MED ORDER — MIDAZOLAM HCL 5 MG/5ML IJ SOLN
INTRAMUSCULAR | Status: DC | PRN
  Administered 2016-04-25: 2 mg via INTRAVENOUS
  Administered 2016-04-25: 1 mg via INTRAVENOUS
  Administered 2016-04-25 (×2): 2 mg via INTRAVENOUS
  Administered 2016-04-25: 1 mg via INTRAVENOUS
  Administered 2016-04-25: 2 mg via INTRAVENOUS

## 2016-04-25 MED ORDER — PANTOPRAZOLE SODIUM 40 MG IV SOLR
40.0000 mg | Freq: Two times a day (BID) | INTRAVENOUS | Status: DC
  Administered 2016-04-25 – 2016-04-27 (×5): 40 mg via INTRAVENOUS
  Filled 2016-04-25 (×5): qty 40

## 2016-04-25 NOTE — H&P (View-Only) (Signed)
     [facility] Progress Note  Subjective: Mid right back pain. Another bloody stool this am .   Objective:  Vital signs in last 24 hours: Temp:  [98.1 F (36.7 C)-99.2 F (37.3 C)] 98.1 F (36.7 C) (10/21 0349) Pulse Rate:  [84-115] 84 (10/21 0349) Resp:  [18-24] 21 (10/21 0349) BP: (128-168)/(60-102) 158/90 (10/21 0349) SpO2:  [98 %-100 %] 100 % (10/21 0349) Weight:  [150 lb 9.2 oz (68.3 kg)] 150 lb 9.2 oz (68.3 kg) (10/20 0915) Last BM Date: 04/23/16 General:   Alert, well-developed, black female in NAD EENT:  Normal hearing, non icteric sclera, conjunctive pink.  Heart:  Regular rate and rhythm; no murmurs.  Pulm: Normal respiratory effort, lungs CTA bilaterally without wheezes or crackles. Abdomen:  Soft, nondistended, nontender.  Normal bowel sounds, no masses felt.  Neurologic:  Alert and  oriented x4;  grossly normal neurologically. Psych:  Alert and cooperative. Normal mood and affect.   Intake/Output from previous day: 10/20 0701 - 10/21 0700 In: 1466.3 [I.V.:1131.3; Blood:335] Out: -  Intake/Output this shift: No intake/output data recorded.  Lab Results:  Recent Labs  04/23/16 0111 04/23/16 0253 04/23/16 1829 04/24/16 0116  WBC 16.1*  --  12.8*  --   HGB 6.7* 6.8* 8.3* 8.3*  HCT 20.1* 20.0* 24.4* 24.5*  PLT 400  --  252  --    BMET  Recent Labs  04/23/16 0111 04/23/16 0253 04/23/16 1829 04/24/16 0116  NA 133* 134* 134* 136  K 4.3 3.6 3.5 3.6  CL 101 97* 104 105  CO2 21*  --  24 24  GLUCOSE 125* 117* 107* 119*  BUN 21* 20 13 9   CREATININE 0.75 0.60 0.71 0.58  CALCIUM 9.1  --  8.6* 8.8*    Nm Gi Blood Loss  Result Date: 04/24/2016 CLINICAL DATA:  45 year old female with GI bleed. EXAM: NUCLEAR MEDICINE GASTROINTESTINAL BLEEDING SCAN TECHNIQUE: Sequential abdominal images were obtained following intravenous administration of Tc-51m labeled red blood cells. RADIOPHARMACEUTICALS:  26.4 mCi Tc-24m in-vitro labeled red cells.  COMPARISON:  Abdominal CT dated 04/23/2016 FINDINGS: Cardiac blood pool as well as activity within the aorta and iliac vessels noted. There is a physiologic uptake in the liver. There is excretion of radiotracer into the bladder. No definite activity identified conforming to the bowel to suggest active GI bleed. IMPRESSION: No definite evidence of active GI bleed. Electronically Signed   By: Anner Crete M.D.   On: 04/24/2016 01:82     50. 45 year old female with painless hematochezia on plavix and ASA. Hgb down a couple of grams since Oct 8th. 8.2 >>>> 6.7 today. Received two units of blood yesterday. Initially hgb did rise at later check it went up to 8.3 and has been stable despite ongoing bleeding. RBC scan was negative. Patient had another bloody stool around 7am. Will prep now for colonoscopy to be done later today. The risks and benefits of the procedure were discussed and the patient agrees to proceed.   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 Vascular team has seen, doubtful there is a fistula to arterial system.  3. Right mid back pain. 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 Right mid back adipose tissue > than left. She is tender to palpation there  . Tye Savoy  04/24/2016, 8:57 AM  Pager number [phone]

## 2016-04-25 NOTE — Op Note (Signed)
[facility] Patient Name: Michelle Joyce Procedure Date : 04/25/2016 MRN: [id number] Attending MD: Carlota Raspberry. Kincaid Tiger MD, MD Date of Birth: July 04, 1971 CSN: [id number] Age: 45 Admit Type: Inpatient Procedure:                Upper GI endoscopy Indications:              Gastrointestinal bleeding of unknown origin -                            patient passed large bloody bowel movement x 1,                            recent syncopal episode. Patient on aspirin and                            plavix Providers:                Remo Lipps P. Jomo Forand MD, MD, Dortha Schwalbe RN,                            RN, William Dalton, Technician Referring MD:              Medicines:                Fentanyl 50 micrograms IV, Midazolam 2 mg IV Complications:            No immediate complications. Estimated blood loss:                            None. Estimated Blood Loss:     Estimated blood loss: none. Procedure:                Pre-Anesthesia Assessment:                           - Prior to the procedure, a History and Physical                            was performed, and patient medications and                            allergies were reviewed. The patient's tolerance of                            previous anesthesia was also reviewed. The risks                            and benefits of the procedure and the sedation                            options and risks were discussed with the patient.                            All questions were answered, and informed consent  was obtained. Prior Anticoagulants: The patient has                            taken Plavix (clopidogrel), last dose was 2 days                            prior to procedure. ASA Grade Assessment: III - A                            patient with severe systemic disease. After                            reviewing the risks and benefits, the patient was                            deemed in  satisfactory condition to undergo the                            procedure.                           After obtaining informed consent, the endoscope was                            passed under direct vision. Throughout the                            procedure, the patient's blood pressure, pulse, and                            oxygen saturations were monitored continuously. The                            [id number]) scope was introduced through the                            mouth, and advanced to the second part of duodenum.                            The upper GI endoscopy was accomplished without                            difficulty. The patient tolerated the procedure                            well. Scope In: Scope Out: Findings:      Esophagogastric landmarks were identified: the Z-line was found at 40       cm, the gastroesophageal junction was found at 40 cm and the upper       extent of the gastric folds was found at 40 cm from the incisors.      A single 7 mm subepithelial nodule was found in the lower third of the       esophagus, 37 cm from the incisors.  The exam of the esophagus was otherwise normal.      The entire examined stomach was normal.      One non-bleeding cratered duodenal ulcer with pigmented material was       found in the duodenal bulb. The lesion was 10 mm in largest dimension.       No active bleeding was noted      The exam of the duodenum was otherwise normal. Impression:               - Esophagogastric landmarks identified.                           - Subepithelial nodule found in the esophagus - no                            biopsies taken given plavix use.                           - Normal stomach.                           - One non-bleeding duodenal ulcer with pigmented                            material which is the likely source of the                            patient's previously bleeding which appears to have                             stopped Moderate Sedation:      Moderate (conscious) sedation was administered by the endoscopy nurse       and supervised by the endoscopist. The following parameters were       monitored: oxygen saturation, heart rate, blood pressure, and response       to care. Total physician intraservice time was 10 minutes. Recommendation:           - Return patient to hospital ward for ongoing care.                           - Clear liquid diet today                           - Continue present medications - IV protonix 40mg                             twice daily                           - Hold plavix for now                           - H pylori IgG serology, treat if positive                           - EUS as outpatient to further evaluate  subepithelial esophagus lesion                           - GI service will continue to follow Procedure Code(s):        --- Professional ---                           [phone], Esophagogastroduodenoscopy, flexible,                            transoral; diagnostic, including collection of                            specimen(s) by brushing or washing, when performed                            (separate procedure)                           99152, Moderate sedation services provided by the                            same physician or other qualified health care                            professional performing the diagnostic or                            therapeutic service that the sedation supports,                            requiring the presence of an independent trained                            observer to assist in the monitoring of the                            patient's level of consciousness and physiological                            status; initial 15 minutes of intraservice time,                            patient age 63 years or older Diagnosis Code(s):        --- Professional ---                           K26.9,  Duodenal ulcer, unspecified as acute or                            chronic, without hemorrhage or perforation                           K92.2, Gastrointestinal hemorrhage, unspecified CPT copyright 2016 [employer]. All rights reserved. The codes documented in this report are preliminary and upon  coder review may  be revised to meet current compliance requirements. Remo Lipps P. Ande Therrell MD, MD 04/25/2016 3:34:31 PM This report has been signed electronically. Number of Addenda: 0

## 2016-04-25 NOTE — Interval H&P Note (Signed)
History and Physical Interval Note:  04/25/2016 2:30 PM  Michelle Joyce  has presented today for surgery, with the diagnosis of gi bleed  The various methods of treatment have been discussed with the patient and family. After consideration of risks, benefits and other options for treatment, the patient has consented to  Procedure(s): COLONOSCOPY (N/A) ESOPHAGOGASTRODUODENOSCOPY (EGD) (N/A) as a surgical intervention .  The patient's history has been reviewed, patient examined, no change in status, stable for surgery.  I have reviewed the patient's chart and labs.  Questions were answered to the patient's satisfaction.     Renelda Loma Jourdon Zimmerle

## 2016-04-25 NOTE — Progress Notes (Signed)
[facility] PROGRESS NOTE  Dlorah Cossairt [id number] DOB: [birth year] DOA: 04/23/2016   PCP: No PCP Per Patient     Assessment/Plan: Principal Problem:   Dissection of right iliac artery (HCC) Active Problems:   Acute blood loss anemia   Hyponatremia   Lactic acidosis   Gastrointestinal hemorrhage   45 y.o. female from [place] who presented to ED around midnight with weakness and vomiting. Patient was in good health until late September when she was hospitalized three times. Patient presented to [facility] in [place] in September with abdominal pain, she underwent SMA stenting for stenosis. Started on plavix post-procedure.  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 Following that she developed a right groin hematoma (surgically evacuated ) and a retroperitoneal hematoma. Following all this patient was admitted a third time with constipation  / impaction. After bowel purge patient felt much better. She did require blood transfusion during two of the recent admissions. Patient had not passed any blood, stools were dark but not black. There was no mention of GI bleeding during any of those admission.  Yesterday she became extremely weak with nausea and loose nonbloody stool. She reports 2 syncopal episodes at home. Patient went to [facility] ED earlier this am.  Hgb 6-7. CTA showed hematoma extending from right inguinal and right iliac vessels into the pelvis, likely subacute, patient transferred to [facility] in case surgical intervention was needed. Upon arriving to [facility] patient had a large dark bloody BM  Assessment and plan   painless hematochezia on plavix and ASA. Hgb down a couple of grams since Oct 8th. 8.2 >>>> 6.8  , now 8.9  after 2 units.  Status post hematochezia 10/20 Plavix on hold Ruled out for upper GI bleeding yesterday by GI tagged RBC studies negative Prepping for colonoscopy today, if the patient  is clear      Anemia of acute blood loss. Received several units of blood in [place] in Sept following vascular procedures complicated by right groin and retroperitoneal hematoma. Hgb low 8 range upon hospital discharge in [place] around Oct 8th. Presented with a hemoglobin of 6.7 ( Transfused 2 units of blood, follow up hgb only 6.8.     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 Following this she developed a right groin hematoma (requring surgical evacuation) and a right retroperitoneal bleed).  Presented to [facility] ED today with weakness, CTA suggested dissection of right external iliac artery. Vascular Surgery recommends conservative measures for now, to continue antiplatelet therapy, which is limited by her GI bleeding issues.  Vascular surgery following If she required IR embolization, it would be beneficial to evaluate her right iliac and femoral arteries She also has RP  hematoma that appears to be stable  Trichomonas UTI, leukocytosis/Proteus mirabilis UTI - Flagyl 2 gm once given in ER -Check for chlamydia/gonorrhea , RPR, HIV Started on Rocephin IV for UTI  Wound right groin - as a result of procedures - cont dressing per patient- will also consult wound care  H/o HTN - cont Amlodipine and Toprol (12.5 mg daily)  Complaint of always being tachycardic-related to her GI bleeding/anemia TSH 0.7   Mid right back pain  Due to 8.2 x8.2 cm loculated hematoma extending from the right inguinal and right iliac vessels into the pelvis above the urinary bladder,likely subacute patient also has  has scoliosis midthoracic spine Will order MRI of the thoracic and the lumbar spine Robaxin as needed for back pain  DVT prophylaxsis SCDs  Code Status:  Full code    Family Communication: Discussed in detail with the patient, all imaging results, lab results  explained to the patient   Disposition Plan:  3-4 days, continue stepdown      Consultants:  GI  Vascular surgery  Procedures:  None  Antibiotics: Anti-infectives    Start     Dose/Rate Route Frequency Ordered Stop   04/24/16 2000  cefTRIAXone (ROCEPHIN) 1 g in dextrose 5 % 50 mL IVPB     1 g 100 mL/hr over 30 Minutes Intravenous Every 24 hours 04/24/16 1920     04/23/16 0900  metroNIDAZOLE (FLAGYL) IVPB 2 g  Status:  Discontinued     2 g 400 mL/hr over 60 Minutes Intravenous  Once 04/23/16 0854 04/23/16 1033   04/23/16 0645  cefTRIAXone (ROCEPHIN) 1 g in dextrose 5 % 50 mL IVPB  Status:  Discontinued     1 g 100 mL/hr over 30 Minutes Intravenous  Once 04/23/16 0640 04/23/16 0856   04/23/16 0515  metroNIDAZOLE (FLAGYL) tablet 2,000 mg     2,000 mg Oral  Once 04/23/16 0510 04/23/16 0725         HPI/Subjective: No bleeding overnight, complaining of midthoracic back pain  Objective: Vitals:   04/24/16 2226 04/24/16 2300 04/25/16 0416 04/25/16 0759  BP: (!) 138/98 (!) 163/99 (!) 142/93 (!) 155/94  Pulse:  82 85 81  Resp:  (!) 24 15 20   Temp:  98.2 F (36.8 C) 98.4 F (36.9 C) 97.9 F (36.6 C)  TempSrc:  Oral Oral Oral  SpO2:  100% 100% 98%  Weight:      Height:        Intake/Output Summary (Last 24 hours) at 04/25/16 0920 Last data filed at 04/25/16 0600  Gross per 24 hour  Intake             2515 ml  Output              550 ml  Net             1965 ml    Exam:  Examination:  General exam: Appears calm and comfortable  Respiratory system: Clear to auscultation. Respiratory effort normal. Cardiovascular system: S1 & S2 heard, RRR. No JVD, murmurs, rubs, gallops or clicks. No pedal edema. Gastrointestinal system: Abdomen is nondistended, soft and nontender. No organomegaly or masses felt. Normal bowel sounds heard. Central nervous system: Alert and oriented. No focal neurological deficits. Extremities: Symmetric 5 x 5 power. Skin: No rashes, lesions  or ulcers Psychiatry: Judgement and insight appear normal. Mood & affect appropriate.     Data Reviewed: I have personally reviewed following labs and imaging studies  Micro Results Recent Results (from the past 240 hour(s))  Urine culture     Status: Abnormal   Collection Time: 04/23/16  4:29 AM  Result Value Ref Range Status   Specimen Description URINE, CLEAN CATCH  Final   Special Requests NONE  Final   Culture >=100,000 COLONIES/mL PROTEUS MIRABILIS (A)  Final   Report Status 04/25/2016 FINAL  Final   Organism ID, Bacteria PROTEUS MIRABILIS (A)  Final      Susceptibility   Proteus mirabilis - MIC*    AMPICILLIN <=2 SENSITIVE Sensitive     CEFAZOLIN <=4 SENSITIVE Sensitive     CEFTRIAXONE <=1 SENSITIVE Sensitive  CIPROFLOXACIN <=0.25 SENSITIVE Sensitive     GENTAMICIN <=1 SENSITIVE Sensitive     IMIPENEM 2 SENSITIVE Sensitive     NITROFURANTOIN 128 RESISTANT Resistant     TRIMETH/SULFA <=20 SENSITIVE Sensitive     AMPICILLIN/SULBACTAM <=2 SENSITIVE Sensitive     PIP/TAZO <=4 SENSITIVE Sensitive     * >=100,000 COLONIES/mL PROTEUS MIRABILIS  MRSA PCR Screening     Status: None   Collection Time: 04/23/16 10:39 AM  Result Value Ref Range Status   MRSA by PCR NEGATIVE NEGATIVE Final    Comment:        The GeneXpert MRSA Assay (FDA approved for NASAL specimens only), is one component of a comprehensive MRSA colonization surveillance program. It is not intended to diagnose MRSA infection nor to guide or monitor treatment for MRSA infections.     Radiology Reports Dg Chest 2 View  Result Date: 04/24/2016 CLINICAL DATA:  Right side chest pain EXAM: CHEST  2 VIEW COMPARISON:  None. FINDINGS: Cardiomediastinal silhouette is unremarkable. There is levoscoliosis mid thoracic spine. No acute infiltrate or pulmonary edema. Mild hyperinflation. IMPRESSION: No active cardiopulmonary disease. Mild hyperinflation. Levoscoliosis mid thoracic spine. Electronically Signed   By:  Lahoma Crocker M.D.   On: 04/24/2016 12:13   Nm Gi Blood Loss  Result Date: 04/24/2016 CLINICAL DATA:  45 year old female with GI bleed. EXAM: NUCLEAR MEDICINE GASTROINTESTINAL BLEEDING SCAN TECHNIQUE: Sequential abdominal images were obtained following intravenous administration of Tc-106m labeled red blood cells. RADIOPHARMACEUTICALS:  26.4 mCi Tc-42m in-vitro labeled red cells. COMPARISON:  Abdominal CT dated 04/23/2016 FINDINGS: Cardiac blood pool as well as activity within the aorta and iliac vessels noted. There is a physiologic uptake in the liver. There is excretion of radiotracer into the bladder. No definite activity identified conforming to the bowel to suggest active GI bleed. IMPRESSION: No definite evidence of active GI bleed. Electronically Signed   By: Anner Crete M.D.   On: 04/24/2016 01:11   Ct Angio Abd/pel W And/or Wo Contrast  Result Date: 04/23/2016 CLINICAL DATA:  45 year old female with 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 Patient presenting with weakness and abdominal pain. EXAM: CTA ABDOMEN AND PELVIS wITHOUT AND WITH CONTRAST TECHNIQUE: Multidetector CT imaging of the abdomen and pelvis was performed using the standard protocol during bolus administration of intravenous contrast. Multiplanar reconstructed images and MIPs were obtained and reviewed to evaluate the vascular anatomy. CONTRAST:  100 cc Isovue 370 COMPARISON:  None. FINDINGS: Evaluation is limited due to respiratory motion artifact. VASCULAR Aorta: There is diffuse predominantly noncalcified plaque along the course of the abdominal aorta. No aneurysmal dilatation or evidence of dissection. There is partial occlusion of the right external iliac artery with a double-lumen appearance of the distal portion of the right external iliac artery concerning for dissection, possibly iatrogenic. Evaluation of these vessels however very limited due to motion artifact. There is also partial occlusion of the  right common femoral artery. There is a 8.2 x 8.2 cm walled-off high attenuating fluid collection extending from the right inguinal and right iliac area into the pelvis above the bladder most compatible with a hematoma, possibly subacute. No definite extravasation of contrast noted within this collection to suggest active bleed. Superimposed infection is not excluded. Celiac: The origin of the celiac axis appears patent. There is an accessory left hepatic artery arising from the left gastric artery. SMA: A SMA stent is noted and appears patent. Evaluation of the stent however is limited due to respiratory motion artifact.  No definite extravasation of contrast or teres stent infiltration identified. The distal SMA appears patent. Renals: Both renal arteries are patent without evidence of aneurysm, dissection, vasculitis, fibromuscular dysplasia or significant stenosis. IMA: Appears unremarkable as visualized. Veins: The IVC is grossly unremarkable. The SMV, splenic vein, and main portal vein are patent. No portal venous gas identified. Review of the MIP images confirms the above findings. NON-VASCULAR Lower chest: The visualized lung bases are clear. No intra-abdominal free air or free fluid Hepatobiliary: The liver appears unremarkable. No intrahepatic biliary ductal dilatation. Vicarious excretion of contrast into the gallbladder noted. Pancreas: Unremarkable. No pancreatic ductal dilatation or surrounding inflammatory changes. Spleen: Normal in size without focal abnormality. Adrenals/Urinary Tract: Ill-defined 1.4 cm nodularity of the left adrenal gland, indeterminate. The right adrenal gland appears unremarkable. The kidneys, visualized ureters appear unremarkable. There is diffuse thickened appearance of the bladder wall which may be secondary to inflammatory changes and hematoma within the pelvis. Correlation with urinalysis recommended to evaluate for cystitis. Stomach/Bowel: Multiple nondistended fluid  filled loops of small bowel noted throughout the abdomen. Loose stool noted within the colon compatible with diarrheal state. There is no evidence of bowel obstruction. Lymphatic: No adenopathy. Reproductive: The uterus is grossly unremarkable. Other: Right inguinal subcutaneous stranding/scarring, likely related to femoral access. There is a small supraumbilical fat containing hernia. Musculoskeletal: No acute or significant osseous findings. IMPRESSION: VASCULAR No evidence of aortic aneurysm or dissection. SMA stent appears unremarkable and patent. Double-lumen appearance of the right external iliac artery concerning for dissection, possibly iatrogenic. There is an 8.2 x 8.2 cm loculated hematoma extending from the right inguinal and right iliac vessels into the pelvis above the urinary bladder, likely subacute. No definite evidence of active bleed. Evaluation of the vasculature is limited due to motion artifact. NON-VASCULAR No acute intra-abdominal pelvic pathology. **An incidental finding of potential clinical significance has been found. A 1.4 cm probable left adrenal nodule, indeterminate. Nonemergent MRI without and with contrast is recommended for further evaluation.** Thickened appearance of the bladder wall, possibly reactive to the hematoma within the pelvis. Correlation with urinalysis recommended to exclude cystitis. These results were called by telephone at the time of interpretation on 04/23/2016 at 6:35 am to Dr. Everlene Balls , who verbally acknowledged these results. Electronically Signed   By: Anner Crete M.D.   On: 04/23/2016 06:36     CBC  Recent Labs Lab 04/23/16 0111 04/23/16 0253 04/23/16 1829 04/24/16 0116 04/24/16 1116 04/25/16 0435  WBC 16.1*  --  12.8*  --  11.3* 10.3  HGB 6.7* 6.8* 8.3* 8.3* 9.1* 8.9*  HCT 20.1* 20.0* 24.4* 24.5* 26.8* 26.9*  PLT 400  --  252  --  279 283  MCV 98.5  --  92.1  --  92.4 94.1  MCH 32.8  --  31.3  --  31.4 31.1  MCHC 33.3  --  34.0   --  34.0 33.1  RDW 19.1*  --  19.4*  --  19.3* 19.0*  LYMPHSABS 2.8  --  2.2  --   --   --   MONOABS 0.9  --  0.9  --   --   --   EOSABS 0.0  --  0.1  --   --   --   BASOSABS 0.0  --  0.0  --   --   --     Chemistries   Recent Labs Lab 04/23/16 0111 04/23/16 0253 04/23/16 1829 04/24/16 0116 04/25/16 0435  NA 133* 134*  134* 136 136  K 4.3 3.6 3.5 3.6 3.5  CL 101 97* 104 105 104  CO2 21*  --  24 24 24   GLUCOSE 125* 117* 107* 119* 106*  BUN 21* 20 13 9  <5*  CREATININE 0.75 0.60 0.71 0.58 0.55  CALCIUM 9.1  --  8.6* 8.8* 9.3  MG 1.8  --   --   --   --   AST 34  --   --   --  18  ALT 15  --   --   --  13*  ALKPHOS 61  --   --   --  63  BILITOT 0.8  --   --   --  0.8   ------------------------------------------------------------------------------------------------------------------ estimated creatinine clearance is 85.1 mL/min (by C-G formula based on SCr of 0.55 mg/dL). ------------------------------------------------------------------------------------------------------------------ No results for input(s): HGBA1C in the last 72 hours. ------------------------------------------------------------------------------------------------------------------ No results for input(s): CHOL, HDL, LDLCALC, TRIG, CHOLHDL, LDLDIRECT in the last 72 hours. ------------------------------------------------------------------------------------------------------------------  Recent Labs  04/23/16 0941  TSH 0.725   ------------------------------------------------------------------------------------------------------------------ No results for input(s): VITAMINB12, FOLATE, FERRITIN, TIBC, IRON, RETICCTPCT in the last 72 hours.  Coagulation profile  Recent Labs Lab 04/23/16 0111 04/23/16 1829  INR 1.05 1.14    No results for input(s): DDIMER in the last 72 hours.  Cardiac Enzymes No results for input(s): CKMB, TROPONINI, MYOGLOBIN in the last 168 hours.  Invalid input(s):  CK ------------------------------------------------------------------------------------------------------------------ Invalid input(s): POCBNP   CBG: No results for input(s): GLUCAP in the last 168 hours.     Studies: Dg Chest 2 View  Result Date: 04/24/2016 CLINICAL DATA:  Right side chest pain EXAM: CHEST  2 VIEW COMPARISON:  None. FINDINGS: Cardiomediastinal silhouette is unremarkable. There is levoscoliosis mid thoracic spine. No acute infiltrate or pulmonary edema. Mild hyperinflation. IMPRESSION: No active cardiopulmonary disease. Mild hyperinflation. Levoscoliosis mid thoracic spine. Electronically Signed   By: Lahoma Crocker M.D.   On: 04/24/2016 12:13   Nm Gi Blood Loss  Result Date: 04/24/2016 CLINICAL DATA:  45 year old female with GI bleed. EXAM: NUCLEAR MEDICINE GASTROINTESTINAL BLEEDING SCAN TECHNIQUE: Sequential abdominal images were obtained following intravenous administration of Tc-9m labeled red blood cells. RADIOPHARMACEUTICALS:  26.4 mCi Tc-49m in-vitro labeled red cells. COMPARISON:  Abdominal CT dated 04/23/2016 FINDINGS: Cardiac blood pool as well as activity within the aorta and iliac vessels noted. There is a physiologic uptake in the liver. There is excretion of radiotracer into the bladder. No definite activity identified conforming to the bowel to suggest active GI bleed. IMPRESSION: No definite evidence of active GI bleed. Electronically Signed   By: Anner Crete M.D.   On: 04/24/2016 01:11      No results found for: HGBA1C Lab Results  Component Value Date   CREATININE 0.55 04/25/2016       Scheduled Meds: . sodium chloride  10 mL/hr Intravenous Once  . amLODipine  10 mg Oral Daily  . cefTRIAXone (ROCEPHIN)  IV  1 g Intravenous Q24H  . feeding supplement  1 Container Oral TID BM  . metoprolol tartrate  12.5 mg Oral BID  . pantoprazole  40 mg Intravenous QHS  . sodium chloride flush  10-40 mL Intracatheter Q12H   Continuous Infusions: . sodium  chloride 100 mL/hr at 04/24/16 2100  . dextrose 5 % and 0.45% NaCl Stopped (04/23/16 2111)     LOS: 2 days    Time spent: >30 MINS    [facility] Pager [phone]. If 7PM-7AM, please contact night-coverage at [email or web address],  password [email or web address] 04/25/2016, 9:20 AM  LOS: 2 days

## 2016-04-25 NOTE — Op Note (Signed)
[facility] Patient Name: Michelle Joyce Procedure Date : 04/25/2016 MRN: [id number] Attending MD: Carlota Raspberry. Pradyun Ishman MD, MD Date of Birth: 1971/01/02 CSN: [id number] Age: 45 Admit Type: Inpatient Procedure:                Colonoscopy Indications:              Evaluation of unexplained GI bleeding Providers:                Remo Lipps P. Makinna Andy MD, MD, Dortha Schwalbe RN,                            RN, William Dalton, Technician Referring MD:              Medicines:                Fentanyl 100 micrograms IV, Midazolam 7 mg IV,                            Diphenhydramine 50 mg IV Complications:            No immediate complications. Estimated blood loss:                            None. Estimated Blood Loss:     Estimated blood loss: none. Procedure:                Pre-Anesthesia Assessment:                           - Prior to the procedure, a History and Physical                            was performed, and patient medications and                            allergies were reviewed. The patient's tolerance of                            previous anesthesia was also reviewed. The risks                            and benefits of the procedure and the sedation                            options and risks were discussed with the patient.                            All questions were answered, and informed consent                            was obtained. Prior Anticoagulants: The patient has                            taken Plavix (clopidogrel), last dose was 2 days  prior to procedure. ASA Grade Assessment: III - A                            patient with severe systemic disease. After                            reviewing the risks and benefits, the patient was                            deemed in satisfactory condition to undergo the                            procedure.                           After obtaining informed consent, the colonoscope                             was passed under direct vision. Throughout the                            procedure, the patient's blood pressure, pulse, and                            oxygen saturations were monitored continuously. The                            EC-[id number] scope was introduced through                            the anus and advanced to the the terminal ileum,                            with identification of the appendiceal orifice and                            IC valve. The colonoscopy was performed without                            difficulty. The patient tolerated the procedure                            well. The quality of the bowel preparation was                            fair. The terminal ileum, ileocecal valve,                            appendiceal orifice, and rectum were photographed. Scope In: 2:46:39 PM Scope Out: 3:05:00 PM Scope Withdrawal Time: 0 hours 11 minutes 14 seconds  Total Procedure Duration: 0 hours 18 minutes 21 seconds  Findings:      The perianal and digital rectal examinations were normal.      Non-bleeding internal hemorrhoids were found during retroflexion. The  hemorrhoids were moderate.      The terminal ileum appeared normal.      The exam was otherwise without abnormality. The bowel prep was only fair       and took some time to lavage. No large polyps or mass lesions, but small       or flat polyps may not have been appreciated in some areas. Impression:               - Preparation of the colon was fair.                           - Non-bleeding internal hemorrhoids.                           - The examined portion of the ileum was normal.                           - The examination was otherwise normal. Moderate Sedation:      Moderate (conscious) sedation was administered by the endoscopy nurse       and supervised by the endoscopist. The following parameters were       monitored: oxygen saturation, heart rate, blood  pressure, and response       to care. Total physician intraservice time was 19 minutes. Recommendation:           - Return patient to hospital ward for ongoing care.                           - Clear liquid diet.                           - Continue present medications                           - Repeat colonoscopy in 1 year for screening                            purposes given fair bowel preparation                           - Additional recommendations per EGD note Procedure Code(s):        --- Professional ---                           [phone], Colonoscopy, flexible; diagnostic, including                            collection of specimen(s) by brushing or washing,                            when performed (separate procedure)                           99152, Moderate sedation services provided by the                            same physician or other qualified health  care                            professional performing the diagnostic or                            therapeutic service that the sedation supports,                            requiring the presence of an independent trained                            observer to assist in the monitoring of the                            patient's level of consciousness and physiological                            status; initial 15 minutes of intraservice time,                            patient age 7 years or older Diagnosis Code(s):        --- Professional ---                           K64.8, Other hemorrhoids                           K92.2, Gastrointestinal hemorrhage, unspecified CPT copyright 2016 [employer]. All rights reserved. The codes documented in this report are preliminary and upon coder review may  be revised to meet current compliance requirements. Remo Lipps P. Treonna Klee MD, MD 04/25/2016 3:39:04 PM This report has been signed electronically. Number of Addenda: 0

## 2016-04-26 ENCOUNTER — Encounter (HOSPITAL_COMMUNITY): Payer: Self-pay | Admitting: Gastroenterology

## 2016-04-26 DIAGNOSIS — D500 Iron deficiency anemia secondary to blood loss (chronic): Secondary | ICD-10-CM

## 2016-04-26 DIAGNOSIS — M549 Dorsalgia, unspecified: Secondary | ICD-10-CM

## 2016-04-26 DIAGNOSIS — K264 Chronic or unspecified duodenal ulcer with hemorrhage: Principal | ICD-10-CM

## 2016-04-26 DIAGNOSIS — M5440 Lumbago with sciatica, unspecified side: Secondary | ICD-10-CM

## 2016-04-26 DIAGNOSIS — S3692XA Contusion of unspecified intra-abdominal organ, initial encounter: Secondary | ICD-10-CM

## 2016-04-26 DIAGNOSIS — IMO0002 Reserved for concepts with insufficient information to code with codable children: Secondary | ICD-10-CM

## 2016-04-26 LAB — TYPE AND SCREEN
ABO/RH(D): O POS
Antibody Screen: POSITIVE
DAT, IGG: NEGATIVE
UNIT DIVISION: 0
Unit division: 0

## 2016-04-26 LAB — CBC
HCT: 23.9 % — ABNORMAL LOW (ref 36.0–46.0)
Hemoglobin: 7.8 g/dL — ABNORMAL LOW (ref 12.0–15.0)
MCH: 31.2 pg (ref 26.0–34.0)
MCHC: 32.6 g/dL (ref 30.0–36.0)
MCV: 95.6 fL (ref 78.0–100.0)
PLATELETS: 276 10*3/uL (ref 150–400)
RBC: 2.5 MIL/uL — AB (ref 3.87–5.11)
RDW: 18.7 % — ABNORMAL HIGH (ref 11.5–15.5)
WBC: 9.1 10*3/uL (ref 4.0–10.5)

## 2016-04-26 LAB — HIV ANTIBODY (ROUTINE TESTING W REFLEX): HIV Screen 4th Generation wRfx: NONREACTIVE

## 2016-04-26 MED ORDER — SUCRALFATE 1 GM/10ML PO SUSP
1.0000 g | Freq: Three times a day (TID) | ORAL | Status: DC
  Administered 2016-04-26 – 2016-04-28 (×9): 1 g via ORAL
  Filled 2016-04-26 (×9): qty 10

## 2016-04-26 MED ORDER — POLYETHYLENE GLYCOL 3350 17 G PO PACK
17.0000 g | PACK | Freq: Every day | ORAL | Status: DC
  Administered 2016-04-26 – 2016-04-28 (×3): 17 g via ORAL
  Filled 2016-04-26 (×3): qty 1

## 2016-04-26 MED ORDER — CYCLOBENZAPRINE HCL 10 MG PO TABS: 10.0000 mg | ORAL_TABLET | Freq: Three times a day (TID) | ORAL | Status: DC | PRN

## 2016-04-26 NOTE — Care Management Note (Addendum)
Case Management Note  Patient Details  Name: Michelle Joyce MRN: [id number] Date of Birth: Mar 13, 1971  Subjective/Objective:  Presents with GIB,    Patient lives with aunt Rise Paganini  And her spouse, Gwyndolyn Saxon  In [place] , she has medicaid out of state, she is from [place] .  She does not have a pcp here yet.  She will be working on getting a pcp. She has transportation when she goes home.  She has medicaid out of state but not sure if this will cover meds here in [place].  NCM will see if can get apt at [facility] clinic, she may need med ast with them.  She had a colonoscopy yesterday , she has an ulcer. Patient will need apt either early in the am or if in the afternoon after 12 noon.  NCM scheduled apt for 11/6 for [facility] , gave her brochure for [facility].   NCM will cont to follow up for dc needs.              Action/Plan:   Expected Discharge Date:  04/25/16               Expected Discharge Plan:  Home/Self Care  In-House Referral:     Discharge planning Services  CM Consult  Post Acute Care Choice:    Choice offered to:     DME Arranged:    DME Agency:     HH Arranged:    HH Agency:     Status of Service:  In process, will continue to follow  If discussed at Long Length of Stay Meetings, dates discussed:    Additional Comments:  Zenon Mayo, RN 04/26/2016, 12:12 PM

## 2016-04-26 NOTE — Progress Notes (Signed)
Daily Rounding Note  04/26/2016, 11:32 AM  LOS: 3 days   SUBJECTIVE:   Chief complaint: burning "from my ulcer" in region [place].  No nausea.  Some abd pain/discomfort. Many, many questions re ulcers and plavix and bleeding   OBJECTIVE:         Vital signs in last 24 hours:    Temp:  [98 F (36.7 C)-99.1 F (37.3 C)] 98.4 F (36.9 C) (10/23 1050) Pulse Rate:  [79-132] 86 (10/23 1051) Resp:  [11-26] 13 (10/23 1051) BP: (109-200)/(67-116) 151/99 (10/23 1051) SpO2:  [97 %-100 %] 100 % (10/23 1051) Last BM Date: 04/25/16 Filed Weights   04/23/16 0915  Weight: 68.3 kg (150 lb 9.2 oz)   General: anxious.  Alert.  Not ill looking   Heart: RRR Chest: clear bil.   Abdomen: soft, ND.  Active BS.  Diffuse tenderness, mild to moderate  Extremities: no CCE Neuro/Psych:  Oriented x 3.  Fully alert and oriented.  No gross deficits.   Intake/Output from previous day: 10/22 0701 - 10/23 0700 In: 2555 [P.O.:100; I.V.:2400; IV Piggyback:55] Out: 300 [Urine:300]  Intake/Output this shift: No intake/output data recorded.  Lab Results:  Recent Labs  04/24/16 1116 04/25/16 0435 04/26/16 0510  WBC 11.3* 10.3 9.1  HGB 9.1* 8.9* 7.8*  HCT 26.8* 26.9* 23.9*  PLT 279 283 276   BMET  Recent Labs  04/23/16 1829 04/24/16 0116 04/25/16 0435  NA 134* 136 136  K 3.5 3.6 3.5  CL 104 105 104  CO2 24 24 24   GLUCOSE 107* 119* 106*  BUN 13 9 <5*  CREATININE 0.71 0.58 0.55  CALCIUM 8.6* 8.8* 9.3   LFT  Recent Labs  04/25/16 0435  PROT 6.5  ALBUMIN 3.6  AST 18  ALT 13*  ALKPHOS 63  BILITOT 0.8   PT/INR  Recent Labs  04/23/16 1829  LABPROT 14.6  INR 1.14   Hepatitis Panel No results for input(s): HEPBSAG, HCVAB, HEPAIGM, HEPBIGM in the last 72 hours.  Studies/Results: Dg Chest 2 View  Result Date: 04/24/2016 CLINICAL DATA:  Right side chest pain EXAM: CHEST  2 VIEW COMPARISON:  None. FINDINGS:  Cardiomediastinal silhouette is unremarkable. There is levoscoliosis mid thoracic spine. No acute infiltrate or pulmonary edema. Mild hyperinflation. IMPRESSION: No active cardiopulmonary disease. Mild hyperinflation. Levoscoliosis mid thoracic spine. Electronically Signed   By: Lahoma Crocker M.D.   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 Mid right back pain Subsequent encounter. EXAM: MRI THORACIC AND LUMBAR SPINE WITHOUT CONTRAST TECHNIQUE: Multiplanar and multiecho pulse sequences of the thoracic and lumbar spine were obtained without intravenous contrast. COMPARISON:  CT angiogram abdomen and pelvis 04/23/2016. FINDINGS: Exam is motion degraded. MRI THORACIC SPINE FINDINGS Alignment:  Scoliosis thoracic spine convex left. Vertebrae: No worrisome abnormality. Cord: No focal cord signal abnormality. Abnormal configuration of the dorsal aspect of the cord at the T5 level. Possibility of a small arachnoid cyst causing mild anterior displacement of the cord is raised. This is difficult to discern with certainty. This does not have signal characteristics of acute hemorrhage. Loculated cerebral spinal fluid at this level from prior insult is also consideration. Scoliosis may contribute to this appearance. Paraspinal and other soft tissues: No worrisome abnormality. Top-normal size thyroid gland. Cervical spondylotic changes C3-4 thru C5-6  incompletely assessed. Disc levels: T1-2:  Mild left facet degenerative changes. T2-3:  Mild left facet degenerative changes. T3-4:  Mild rotation. T4-5:  Mild rotation.  Minimal spur greater to the right. T5-6:  Minimal facet degenerative changes. T6-7:  Minimal spur greater to the right. T7-8:  Negative. T8-9:  Negative. T9-10:  Minimal spur. T10-11: Mild bulge and spur greater to left.  Slight impression left ventral thecal sac. Slight rotation. Minimal facet bony overgrowth. T11-12: Mild left facet degenerative changes. Minimal rotation minimal bulge. T12-L1:  Mild facet degenerative change greater on the left. Exam is motion degraded. MRI LUMBAR SPINE FINDINGS Segmentation:  Last fully open disc space labeled L5. Alignment:  Minimal curvature. Vertebrae:  No worrisome abnormality. Conus medullaris: Extends to the upper L2 level level and appears normal. No evidence of a epidural hematoma. Paraspinal and other soft tissues: Complex 5 cm mass superior to the bladder. This may represent hematoma or pseudoaneurysm from vascular surgery. The fact that this communicates with right femoral artery region where patient has had prior surgeries suggests this is not a primary ovarian lesion. Superimposed infection not excluded if of clinical concern. Fluid filled bowel. Gallstones. Disc levels: L1-2:  Mild facet degenerative changes. L2-3:  Minimal left facet bony overgrowth. L3-4: Minimal bulge greater laterally. Minimal facet bony overgrowth. L4-5: Moderate facet degenerative changes greater on the right. Ligamentum flavum hypertrophy. Mild bulge. No significant spinal stenosis or foraminal narrowing. L5-S1: Prominent facet degenerative changes greater on the right. Mild foraminal narrowing greater on left. No nerve root compression. No significant spinal stenosis. IMPRESSION: Exam is motion degraded. MR THORACIC SPINE IMPRESSION Scoliosis thoracic spine convex left. Vertebrae: No worrisome abnormality. Abnormal configuration of the dorsal aspect of the cord at the T5 level. Possibility of a small arachnoid cyst causing mild anterior displacement of the cord is raised. This is difficult to discern with certainty. This does not have signal characteristics of acute hemorrhage. Loculated cerebral spinal fluid at this level from prior insult is also consideration. Scoliosis and abnormal cerebral spinal fluid  flow may contribute to this appearance. Minimal to mild degenerative changes most prominent T10-11 level without significant spinal stenosis. MR LUMBAR SPINE IMPRESSION No evidence of a epidural hematoma. Complex 5 cm mass superior to the bladder. This may represent hematoma or pseudoaneurysm from vascular surgery. Fluid filled bowel. L4-5 moderate facet degenerative changes greater on the right. Ligamentum flavum hypertrophy. Mild bulge. No significant spinal stenosis or foraminal narrowing. L5-S1 prominent facet degenerative changes greater on the right. Mild foraminal narrowing greater on left. No nerve root compression. No significant spinal stenosis. Electronically Signed   By: Genia Del M.D.   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 Mid right back pain Subsequent encounter. EXAM: MRI THORACIC AND LUMBAR SPINE WITHOUT CONTRAST TECHNIQUE: Multiplanar and multiecho pulse sequences of the thoracic and lumbar spine were obtained without intravenous contrast. COMPARISON:  CT angiogram abdomen and pelvis 04/23/2016. FINDINGS: Exam is motion degraded. MRI THORACIC SPINE FINDINGS Alignment:  Scoliosis thoracic spine convex left. Vertebrae: No worrisome abnormality. Cord: No focal cord signal abnormality. Abnormal configuration of the dorsal aspect of the cord at the T5 level. Possibility of a small arachnoid cyst causing mild anterior displacement of the cord is raised. This is difficult to discern with certainty. This does not have signal characteristics of acute hemorrhage. Loculated cerebral  spinal fluid at this level from prior insult is also consideration. Scoliosis may contribute to this appearance. Paraspinal and other soft tissues: No worrisome abnormality. Top-normal size thyroid gland. Cervical spondylotic  changes C3-4 thru C5-6 incompletely assessed. Disc levels: T1-2:  Mild left facet degenerative changes. T2-3:  Mild left facet degenerative changes. T3-4:  Mild rotation. T4-5:  Mild rotation.  Minimal spur greater to the right. T5-6:  Minimal facet degenerative changes. T6-7:  Minimal spur greater to the right. T7-8:  Negative. T8-9:  Negative. T9-10:  Minimal spur. T10-11: Mild bulge and spur greater to left. Slight impression left ventral thecal sac. Slight rotation. Minimal facet bony overgrowth. T11-12: Mild left facet degenerative changes. Minimal rotation minimal bulge. T12-L1:  Mild facet degenerative change greater on the left. Exam is motion degraded. MRI LUMBAR SPINE FINDINGS Segmentation:  Last fully open disc space labeled L5. Alignment:  Minimal curvature. Vertebrae:  No worrisome abnormality. Conus medullaris: Extends to the upper L2 level level and appears normal. No evidence of a epidural hematoma. Paraspinal and other soft tissues: Complex 5 cm mass superior to the bladder. This may represent hematoma or pseudoaneurysm from vascular surgery. The fact that this communicates with right femoral artery region where patient has had prior surgeries suggests this is not a primary ovarian lesion. Superimposed infection not excluded if of clinical concern. Fluid filled bowel. Gallstones. Disc levels: L1-2:  Mild facet degenerative changes. L2-3:  Minimal left facet bony overgrowth. L3-4: Minimal bulge greater laterally. Minimal facet bony overgrowth. L4-5: Moderate facet degenerative changes greater on the right. Ligamentum flavum hypertrophy. Mild bulge. No significant spinal stenosis or foraminal narrowing. L5-S1: Prominent facet degenerative changes greater on the right. Mild foraminal narrowing greater on left. No nerve root compression. No significant spinal stenosis. IMPRESSION: Exam is motion degraded. MR THORACIC SPINE IMPRESSION Scoliosis thoracic spine convex left. Vertebrae: No worrisome  abnormality. Abnormal configuration of the dorsal aspect of the cord at the T5 level. Possibility of a small arachnoid cyst causing mild anterior displacement of the cord is raised. This is difficult to discern with certainty. This does not have signal characteristics of acute hemorrhage. Loculated cerebral spinal fluid at this level from prior insult is also consideration. Scoliosis and abnormal cerebral spinal fluid flow may contribute to this appearance. Minimal to mild degenerative changes most prominent T10-11 level without significant spinal stenosis. MR LUMBAR SPINE IMPRESSION No evidence of a epidural hematoma. Complex 5 cm mass superior to the bladder. This may represent hematoma or pseudoaneurysm from vascular surgery. Fluid filled bowel. L4-5 moderate facet degenerative changes greater on the right. Ligamentum flavum hypertrophy. Mild bulge. No significant spinal stenosis or foraminal narrowing. L5-S1 prominent facet degenerative changes greater on the right. Mild foraminal narrowing greater on left. No nerve root compression. No significant spinal stenosis. Electronically Signed   By: Genia Del M.D.   On: 04/25/2016 13:59   Scheduled Meds: . sodium chloride  10 mL/hr Intravenous Once  . amLODipine  10 mg Oral Daily  . cefTRIAXone (ROCEPHIN)  IV  1 g Intravenous Q24H  . feeding supplement  1 Container Oral TID BM  .  HYDROmorphone (DILAUDID) injection  2 mg Intravenous Once  . metoprolol tartrate  12.5 mg Oral BID  . pantoprazole  40 mg Intravenous Q12H  . sodium chloride flush  10-40 mL Intracatheter Q12H  . sucralfate  1 g Oral TID WC & HS   Continuous Infusions: . sodium chloride 100 mL/hr (04/26/16 0918)  . dextrose 5 % and 0.45%  NaCl Stopped (04/23/16 2111)   PRN Meds:.acetaminophen **OR** acetaminophen, HYDROcodone-acetaminophen, magnesium hydroxide, methocarbamol (ROBAXIN)  IV, ondansetron **OR** ondansetron (ZOFRAN) IV, sodium chloride flush   ASSESMENT:   *  GI bleed,  bloody BM.  No PPI or NSAIDs PTA. Now on IV, BID PPI and po Carafate.  10/22 Colonoscopy:  Non-bleeding hemorrhoids.  Fair prep.  10/22 EGD: Subepithelial nodule found in the esophagus - no biopsies taken given plavix use.                     Normal stomach.                      One non-bleeding duodenal ulcer with pigmented                            material which is the likely source of the                            patient's previously bleeding which appears have stopped Now on BID IV Protonix.   *  Acute on chronic anemia..  Hgb drop 1 gram in last 24 hours. No further bloody stool.   *  Chronic Plavix, 81 ASA on hold.  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  Given her recent multiple interventions, I would not recommend any treatment at this time, other than anti-platelet medications.  He RP hematoma appears to be similar to what is described from her CT scan reports from [place].  No exploration is indicated"   *  Anxiety.    *  Chronic constipation.    PLAN   *  CBC in AM.  Continue IV Protonix and Carafate.   *  ? When ok  To restart Plavix, was told it should be given x 6 months after the stent was placed.   *  Add miralax. As per home schedule.   *  Full  Liquid diet.     Michelle Joyce  04/26/2016, 11:32 AM Pager: [phone]  I have discussed the case with the PA, and that is the plan I formulated. I personally interviewed and examined the patient.  CC: duodenal ulcer with bleeding  The patient has no further overt bleeding.  I had a long discussion with her regarding the ulcer and bleeding risk.  She understands the  uncertainty of rebleeding risk when and if anti-platelet agents are resumed.  Ideally, I would like her not to be on aspirin or plavix for a minimum of 4 weeks.  However, I understand how this presents a risk of her mesenteric stent occluding.  My advice:    Follow up on serum H pylori antibody and treat if positive. Twice daily oral PPI for 8 weeks. OK for regular diet - ordered Get the report from her recent SMA stent placement in [place].  Then ask our IR team for their best advice on how long to use antiplatelet agents with the type of stent she had placed.  If she needs one antiplatelet agent, then I advise resuming  plavix 10 days from now, understanding that is my best compromise of the risks/benefits.  If aspirin is also necessary, please do not resume it for 4 weeks.  We will sign off.  Patient should follow up with Dr Havery Moros 3-4 weeks after discharge.  Nelida Meuse III Pager [phone]  Mon-Fri 8a-5p [phone] after 5p, weekends, holidays

## 2016-04-26 NOTE — Progress Notes (Signed)
[facility] PROGRESS NOTE  Jasonna Vanrossum [id number] DOB: 05-02-1971 DOA: 04/23/2016   PCP: No PCP Per Patient     Assessment/Plan: Principal Problem:   Dissection of right iliac artery (HCC) Active Problems:   Acute blood loss anemia   Hyponatremia   Lactic acidosis   Gastrointestinal hemorrhage   45 y.o. female from [place] who presented to ED around midnight with weakness and vomiting. Patient was in good health until late September when she was hospitalized three times. Patient presented to [facility] in [place] in September with abdominal pain, she underwent SMA stenting for stenosis. Started on plavix post-procedure.  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 Following that she developed a right groin hematoma (surgically evacuated ) and a retroperitoneal hematoma. Following all this patient was admitted a third time with constipation  / impaction. After bowel purge patient felt much better. She did require blood transfusion during two of the recent admissions. Patient had not passed any blood, stools were dark but not black. There was no mention of GI bleeding during any of those admission.  Yesterday she became extremely weak with nausea and loose nonbloody stool. She reports 2 syncopal episodes at home. Patient went to [facility] ED earlier this am.  Hgb 6-7. CTA showed hematoma extending from right inguinal and right iliac vessels into the pelvis, likely subacute, patient transferred to [facility] in case surgical intervention was needed. Upon arriving to [facility] patient had a large dark bloody BM  Assessment and plan  painless hematochezia on plavix and ASA. Hgb down a couple of grams since Oct 8th. 8.2 >>>> 6.8  , now 9.1>7.8  after 2 units. No further episodes of bleeding  Status post hematochezia 10/20 Plavix on hold, GI recommends to keep this on hold EGD showed nonbleeding duodenal ulcer, H. pylori IgG  serology pending Colonoscopy -nonbleeding internal hemorrhoids-GI recommends repeat colonoscopy in one year tagged RBC studies negative Monitor CBC closely, if okay with GI may be able to  start aspirin, resume Plavix in a few days? Started on Carafate for heartburn    Anemia of acute blood loss. Received several units of blood in [place] in Sept following vascular procedures complicated by right groin and retroperitoneal hematoma. Hgb low 8 range upon hospital discharge in [place] around Oct 8th. Presented with a hemoglobin of 6.7 ( Transfused 2 units of blood), hemoglobin now trending down [id number]   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 Following this she developed a right groin hematoma (requring surgical evacuation) and a right retroperitoneal bleed).  Presented to [facility] ED today with weakness, CTA suggested dissection of right external iliac artery. Vascular Surgery recommends conservative measures for now, to continue antiplatelet therapy, which is limited by her GI bleeding issues.  Vascular surgery following If she required IR embolization, it would be beneficial to evaluate her right iliac and femoral arteries She also has RP  hematoma that appears to be stable   Trichomonas UTI, leukocytosis/Proteus mirabilis UTI - Flagyl 2 gm once given in ER -Check for chlamydia/gonorrhea , RPR, HIV Continue Rocephin IV for UTI,  Wound right groin - as a result of procedures - cont dressing per patient- will also consult wound care  H/o HTN - cont Amlodipine and Toprol (12.5 mg daily)  Complaint of always being tachycardic-related to her GI bleeding/anemia TSH 0.7  Mid right back pain  Due to 8.2 x8.2 cm loculated hematoma extending from the right inguinal and right iliac vessels into the pelvis above the urinary bladder,likely subacute patient also  has has scoliosis midthoracic spine MRI consistent with scoliosis, degenerative changes, without significant spinal stenosis, no.nerve root compression Continue  Robaxin as needed for back pain  DVT prophylaxsis SCDs  Code Status:  Full code    Family Communication: Discussed in detail with the patient, all imaging results, lab results explained to the patient   Disposition Plan: Transfer to telemetry, PT OT consultation      Consultants:  GI  Vascular surgery  Procedures:  None  Antibiotics: Anti-infectives    Start     Dose/Rate Route Frequency Ordered Stop   04/24/16 2000  cefTRIAXone (ROCEPHIN) 1 g in dextrose 5 % 50 mL IVPB     1 g 100 mL/hr over 30 Minutes Intravenous Every 24 hours 04/24/16 1920     04/23/16 0900  metroNIDAZOLE (FLAGYL) IVPB 2 g  Status:  Discontinued     2 g 400 mL/hr over 60 Minutes Intravenous  Once 04/23/16 0854 04/23/16 1033   04/23/16 0645  cefTRIAXone (ROCEPHIN) 1 g in dextrose 5 % 50 mL IVPB  Status:  Discontinued     1 g 100 mL/hr over 30 Minutes Intravenous  Once 04/23/16 0640 04/23/16 0856   04/23/16 0515  metroNIDAZOLE (FLAGYL) tablet 2,000 mg     2,000 mg Oral  Once 04/23/16 0510 04/23/16 0725         HPI/Subjective: Chief complaint: burning "from my ulcer" in region [place].  No nausea  Objective: Vitals:   04/26/16 0301 04/26/16 0700 04/26/16 1050 04/26/16 1051  BP: 109/67 (!) 148/88  (!) 151/99  Pulse: 82 88  86  Resp: 12 (!) 26  13  Temp: 98.4 F (36.9 C) 98 F (36.7 C) 98.4 F (36.9 C)   TempSrc: Oral Oral Oral   SpO2: 99% 97%  100%  Weight:      Height:        Intake/Output Summary (Last 24 hours) at 04/26/16 1058 Last data filed at 04/26/16 0600  Gross per 24 hour  Intake             2555 ml  Output              300 ml  Net             2255 ml    Exam:  Examination:  General exam: Appears calm and comfortable  Respiratory system: Clear to auscultation. Respiratory effort normal. Cardiovascular  system: S1 & S2 heard, RRR. No JVD, murmurs, rubs, gallops or clicks. No pedal edema. Gastrointestinal system: Abdomen is nondistended, soft and nontender. No organomegaly or masses felt. Normal bowel sounds heard. Central nervous system: Alert and oriented. No focal neurological deficits. Extremities: Symmetric 5 x 5 power. Skin: No rashes, lesions or ulcers Psychiatry: Judgement and insight appear normal. Mood & affect appropriate.     Data Reviewed: I have personally reviewed following labs and imaging studies  Micro Results Recent Results (from the past 240 hour(s))  Urine culture     Status: Abnormal   Collection Time: 04/23/16  4:29 AM  Result Value Ref Range Status   Specimen Description URINE, CLEAN CATCH  Final   Special Requests NONE  Final   Culture >=100,000 COLONIES/mL PROTEUS MIRABILIS (A)  Final   Report Status 04/25/2016 FINAL  Final   Organism ID, Bacteria PROTEUS  MIRABILIS (A)  Final      Susceptibility   Proteus mirabilis - MIC*    AMPICILLIN <=2 SENSITIVE Sensitive     CEFAZOLIN <=4 SENSITIVE Sensitive     CEFTRIAXONE <=1 SENSITIVE Sensitive     CIPROFLOXACIN <=0.25 SENSITIVE Sensitive     GENTAMICIN <=1 SENSITIVE Sensitive     IMIPENEM 2 SENSITIVE Sensitive     NITROFURANTOIN 128 RESISTANT Resistant     TRIMETH/SULFA <=20 SENSITIVE Sensitive     AMPICILLIN/SULBACTAM <=2 SENSITIVE Sensitive     PIP/TAZO <=4 SENSITIVE Sensitive     * >=100,000 COLONIES/mL PROTEUS MIRABILIS  MRSA PCR Screening     Status: None   Collection Time: 04/23/16 10:39 AM  Result Value Ref Range Status   MRSA by PCR NEGATIVE NEGATIVE Final    Comment:        The GeneXpert MRSA Assay (FDA approved for NASAL specimens only), is one component of a comprehensive MRSA colonization surveillance program. It is not intended to diagnose MRSA infection nor to guide or monitor treatment for MRSA infections.     Radiology Reports Dg Chest 2 View  Result Date: 04/24/2016 CLINICAL  DATA:  Right side chest pain EXAM: CHEST  2 VIEW COMPARISON:  None. FINDINGS: Cardiomediastinal silhouette is unremarkable. There is levoscoliosis mid thoracic spine. No acute infiltrate or pulmonary edema. Mild hyperinflation. IMPRESSION: No active cardiopulmonary disease. Mild hyperinflation. Levoscoliosis mid thoracic spine. Electronically Signed   By: Lahoma Crocker M.D.   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 Mid right back pain Subsequent encounter. EXAM: MRI THORACIC AND LUMBAR SPINE WITHOUT CONTRAST TECHNIQUE: Multiplanar and multiecho pulse sequences of the thoracic and lumbar spine were obtained without intravenous contrast. COMPARISON:  CT angiogram abdomen and pelvis 04/23/2016. FINDINGS: Exam is motion degraded. MRI THORACIC SPINE FINDINGS Alignment:  Scoliosis thoracic spine convex left. Vertebrae: No worrisome abnormality. Cord: No focal cord signal abnormality. Abnormal configuration of the dorsal aspect of the cord at the T5 level. Possibility of a small arachnoid cyst causing mild anterior displacement of the cord is raised. This is difficult to discern with certainty. This does not have signal characteristics of acute hemorrhage. Loculated cerebral spinal fluid at this level from prior insult is also consideration. Scoliosis may contribute to this appearance. Paraspinal and other soft tissues: No worrisome abnormality. Top-normal size thyroid gland. Cervical spondylotic changes C3-4 thru C5-6 incompletely assessed. Disc levels: T1-2:  Mild left facet degenerative changes. T2-3:  Mild left facet degenerative changes. T3-4:  Mild rotation. T4-5:  Mild rotation.  Minimal spur greater to the right. T5-6:  Minimal facet degenerative changes. T6-7:  Minimal spur greater to the right. T7-8:  Negative. T8-9:   Negative. T9-10:  Minimal spur. T10-11: Mild bulge and spur greater to left. Slight impression left ventral thecal sac. Slight rotation. Minimal facet bony overgrowth. T11-12: Mild left facet degenerative changes. Minimal rotation minimal bulge. T12-L1:  Mild facet degenerative change greater on the left. Exam is motion degraded. MRI LUMBAR SPINE FINDINGS Segmentation:  Last fully open disc space labeled L5. Alignment:  Minimal curvature. Vertebrae:  No worrisome abnormality. Conus medullaris: Extends to the upper L2 level level and appears normal. No evidence of a epidural hematoma. Paraspinal and other soft tissues: Complex 5 cm mass superior to the bladder. This may represent hematoma or pseudoaneurysm from vascular surgery. The  fact that this communicates with right femoral artery region where patient has had prior surgeries suggests this is not a primary ovarian lesion. Superimposed infection not excluded if of clinical concern. Fluid filled bowel. Gallstones. Disc levels: L1-2:  Mild facet degenerative changes. L2-3:  Minimal left facet bony overgrowth. L3-4: Minimal bulge greater laterally. Minimal facet bony overgrowth. L4-5: Moderate facet degenerative changes greater on the right. Ligamentum flavum hypertrophy. Mild bulge. No significant spinal stenosis or foraminal narrowing. L5-S1: Prominent facet degenerative changes greater on the right. Mild foraminal narrowing greater on left. No nerve root compression. No significant spinal stenosis. IMPRESSION: Exam is motion degraded. MR THORACIC SPINE IMPRESSION Scoliosis thoracic spine convex left. Vertebrae: No worrisome abnormality. Abnormal configuration of the dorsal aspect of the cord at the T5 level. Possibility of a small arachnoid cyst causing mild anterior displacement of the cord is raised. This is difficult to discern with certainty. This does not have signal characteristics of acute hemorrhage. Loculated cerebral spinal fluid at this level from prior  insult is also consideration. Scoliosis and abnormal cerebral spinal fluid flow may contribute to this appearance. Minimal to mild degenerative changes most prominent T10-11 level without significant spinal stenosis. MR LUMBAR SPINE IMPRESSION No evidence of a epidural hematoma. Complex 5 cm mass superior to the bladder. This may represent hematoma or pseudoaneurysm from vascular surgery. Fluid filled bowel. L4-5 moderate facet degenerative changes greater on the right. Ligamentum flavum hypertrophy. Mild bulge. No significant spinal stenosis or foraminal narrowing. L5-S1 prominent facet degenerative changes greater on the right. Mild foraminal narrowing greater on left. No nerve root compression. No significant spinal stenosis. Electronically Signed   By: Genia Del M.D.   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 Mid right back pain Subsequent encounter. EXAM: MRI THORACIC AND LUMBAR SPINE WITHOUT CONTRAST TECHNIQUE: Multiplanar and multiecho pulse sequences of the thoracic and lumbar spine were obtained without intravenous contrast. COMPARISON:  CT angiogram abdomen and pelvis 04/23/2016. FINDINGS: Exam is motion degraded. MRI THORACIC SPINE FINDINGS Alignment:  Scoliosis thoracic spine convex left. Vertebrae: No worrisome abnormality. Cord: No focal cord signal abnormality. Abnormal configuration of the dorsal aspect of the cord at the T5 level. Possibility of a small arachnoid cyst causing mild anterior displacement of the cord is raised. This is difficult to discern with certainty. This does not have signal characteristics of acute hemorrhage. Loculated cerebral spinal fluid at this level from prior insult is also consideration. Scoliosis may contribute to this appearance. Paraspinal and other soft tissues: No  worrisome abnormality. Top-normal size thyroid gland. Cervical spondylotic changes C3-4 thru C5-6 incompletely assessed. Disc levels: T1-2:  Mild left facet degenerative changes. T2-3:  Mild left facet degenerative changes. T3-4:  Mild rotation. T4-5:  Mild rotation.  Minimal spur greater to the right. T5-6:  Minimal facet degenerative changes. T6-7:  Minimal spur greater to the right. T7-8:  Negative. T8-9:  Negative. T9-10:  Minimal spur. T10-11: Mild bulge and spur greater to left. Slight impression left ventral thecal sac. Slight rotation. Minimal facet bony overgrowth. T11-12: Mild left facet degenerative changes. Minimal rotation minimal bulge. T12-L1:  Mild facet degenerative change greater on the left. Exam is motion degraded. MRI LUMBAR SPINE FINDINGS Segmentation:  Last fully open disc space labeled L5. Alignment:  Minimal curvature. Vertebrae:  No worrisome abnormality. Conus medullaris: Extends to the upper  L2 level level and appears normal. No evidence of a epidural hematoma. Paraspinal and other soft tissues: Complex 5 cm mass superior to the bladder. This may represent hematoma or pseudoaneurysm from vascular surgery. The fact that this communicates with right femoral artery region where patient has had prior surgeries suggests this is not a primary ovarian lesion. Superimposed infection not excluded if of clinical concern. Fluid filled bowel. Gallstones. Disc levels: L1-2:  Mild facet degenerative changes. L2-3:  Minimal left facet bony overgrowth. L3-4: Minimal bulge greater laterally. Minimal facet bony overgrowth. L4-5: Moderate facet degenerative changes greater on the right. Ligamentum flavum hypertrophy. Mild bulge. No significant spinal stenosis or foraminal narrowing. L5-S1: Prominent facet degenerative changes greater on the right. Mild foraminal narrowing greater on left. No nerve root compression. No significant spinal stenosis. IMPRESSION: Exam is motion degraded. MR THORACIC SPINE  IMPRESSION Scoliosis thoracic spine convex left. Vertebrae: No worrisome abnormality. Abnormal configuration of the dorsal aspect of the cord at the T5 level. Possibility of a small arachnoid cyst causing mild anterior displacement of the cord is raised. This is difficult to discern with certainty. This does not have signal characteristics of acute hemorrhage. Loculated cerebral spinal fluid at this level from prior insult is also consideration. Scoliosis and abnormal cerebral spinal fluid flow may contribute to this appearance. Minimal to mild degenerative changes most prominent T10-11 level without significant spinal stenosis. MR LUMBAR SPINE IMPRESSION No evidence of a epidural hematoma. Complex 5 cm mass superior to the bladder. This may represent hematoma or pseudoaneurysm from vascular surgery. Fluid filled bowel. L4-5 moderate facet degenerative changes greater on the right. Ligamentum flavum hypertrophy. Mild bulge. No significant spinal stenosis or foraminal narrowing. L5-S1 prominent facet degenerative changes greater on the right. Mild foraminal narrowing greater on left. No nerve root compression. No significant spinal stenosis. Electronically Signed   By: Genia Del M.D.   On: 04/25/2016 13:59   Nm Gi Blood Loss  Result Date: 04/24/2016 CLINICAL DATA:  45 year old female with GI bleed. EXAM: NUCLEAR MEDICINE GASTROINTESTINAL BLEEDING SCAN TECHNIQUE: Sequential abdominal images were obtained following intravenous administration of Tc-34m labeled red blood cells. RADIOPHARMACEUTICALS:  26.4 mCi Tc-12m in-vitro labeled red cells. COMPARISON:  Abdominal CT dated 04/23/2016 FINDINGS: Cardiac blood pool as well as activity within the aorta and iliac vessels noted. There is a physiologic uptake in the liver. There is excretion of radiotracer into the bladder. No definite activity identified conforming to the bowel to suggest active GI bleed. IMPRESSION: No definite evidence of active GI bleed.  Electronically Signed   By: Anner Crete M.D.   On: 04/24/2016 01:11   Ct Angio Abd/pel W And/or Wo Contrast  Result Date: 04/23/2016 CLINICAL DATA:  45 year old female with 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 Patient presenting with weakness and abdominal pain. EXAM: CTA ABDOMEN AND PELVIS wITHOUT AND WITH CONTRAST TECHNIQUE: Multidetector CT imaging of the abdomen and pelvis was performed using the standard protocol during bolus administration of intravenous contrast. Multiplanar reconstructed images and MIPs were obtained and reviewed to evaluate the vascular anatomy. CONTRAST:  100 cc Isovue 370 COMPARISON:  None. FINDINGS: Evaluation is limited due to respiratory motion artifact. VASCULAR Aorta: There is diffuse predominantly noncalcified plaque along the course of the abdominal aorta. No aneurysmal dilatation or evidence of dissection. There is partial occlusion of the right external iliac artery with a double-lumen appearance of the distal portion of the right external iliac artery concerning for dissection, possibly iatrogenic. Evaluation of these vessels  however very limited due to motion artifact. There is also partial occlusion of the right common femoral artery. There is a 8.2 x 8.2 cm walled-off high attenuating fluid collection extending from the right inguinal and right iliac area into the pelvis above the bladder most compatible with a hematoma, possibly subacute. No definite extravasation of contrast noted within this collection to suggest active bleed. Superimposed infection is not excluded. Celiac: The origin of the celiac axis appears patent. There is an accessory left hepatic artery arising from the left gastric artery. SMA: A SMA stent is noted and appears patent. Evaluation of the stent however is limited due to respiratory motion artifact. No definite extravasation of contrast or teres stent infiltration identified. The distal SMA appears patent. Renals: Both  renal arteries are patent without evidence of aneurysm, dissection, vasculitis, fibromuscular dysplasia or significant stenosis. IMA: Appears unremarkable as visualized. Veins: The IVC is grossly unremarkable. The SMV, splenic vein, and main portal vein are patent. No portal venous gas identified. Review of the MIP images confirms the above findings. NON-VASCULAR Lower chest: The visualized lung bases are clear. No intra-abdominal free air or free fluid Hepatobiliary: The liver appears unremarkable. No intrahepatic biliary ductal dilatation. Vicarious excretion of contrast into the gallbladder noted. Pancreas: Unremarkable. No pancreatic ductal dilatation or surrounding inflammatory changes. Spleen: Normal in size without focal abnormality. Adrenals/Urinary Tract: Ill-defined 1.4 cm nodularity of the left adrenal gland, indeterminate. The right adrenal gland appears unremarkable. The kidneys, visualized ureters appear unremarkable. There is diffuse thickened appearance of the bladder wall which may be secondary to inflammatory changes and hematoma within the pelvis. Correlation with urinalysis recommended to evaluate for cystitis. Stomach/Bowel: Multiple nondistended fluid filled loops of small bowel noted throughout the abdomen. Loose stool noted within the colon compatible with diarrheal state. There is no evidence of bowel obstruction. Lymphatic: No adenopathy. Reproductive: The uterus is grossly unremarkable. Other: Right inguinal subcutaneous stranding/scarring, likely related to femoral access. There is a small supraumbilical fat containing hernia. Musculoskeletal: No acute or significant osseous findings. IMPRESSION: VASCULAR No evidence of aortic aneurysm or dissection. SMA stent appears unremarkable and patent. Double-lumen appearance of the right external iliac artery concerning for dissection, possibly iatrogenic. There is an 8.2 x 8.2 cm loculated hematoma extending from the right inguinal and right  iliac vessels into the pelvis above the urinary bladder, likely subacute. No definite evidence of active bleed. Evaluation of the vasculature is limited due to motion artifact. NON-VASCULAR No acute intra-abdominal pelvic pathology. **An incidental finding of potential clinical significance has been found. A 1.4 cm probable left adrenal nodule, indeterminate. Nonemergent MRI without and with contrast is recommended for further evaluation.** Thickened appearance of the bladder wall, possibly reactive to the hematoma within the pelvis. Correlation with urinalysis recommended to exclude cystitis. These results were called by telephone at the time of interpretation on 04/23/2016 at 6:35 am to Dr. Everlene Balls , who verbally acknowledged these results. Electronically Signed   By: Anner Crete M.D.   On: 04/23/2016 06:36     CBC  Recent Labs Lab 04/23/16 0111  04/23/16 1829 04/24/16 0116 04/24/16 1116 04/25/16 0435 04/26/16 0510  WBC 16.1*  --  12.8*  --  11.3* 10.3 9.1  HGB 6.7*  < > 8.3* 8.3* 9.1* 8.9* 7.8*  HCT 20.1*  < > 24.4* 24.5* 26.8* 26.9* 23.9*  PLT 400  --  252  --  279 283 276  MCV 98.5  --  92.1  --  92.4 94.1 95.6  MCH 32.8  --  31.3  --  31.4 31.1 31.2  MCHC 33.3  --  34.0  --  34.0 33.1 32.6  RDW 19.1*  --  19.4*  --  19.3* 19.0* 18.7*  LYMPHSABS 2.8  --  2.2  --   --   --   --   MONOABS 0.9  --  0.9  --   --   --   --   EOSABS 0.0  --  0.1  --   --   --   --   BASOSABS 0.0  --  0.0  --   --   --   --   < > = values in this interval not displayed.  Chemistries   Recent Labs Lab 04/23/16 0111 04/23/16 0253 04/23/16 1829 04/24/16 0116 04/25/16 0435  NA 133* 134* 134* 136 136  K 4.3 3.6 3.5 3.6 3.5  CL 101 97* 104 105 104  CO2 21*  --  24 24 24   GLUCOSE 125* 117* 107* 119* 106*  BUN 21* 20 13 9  <5*  CREATININE 0.75 0.60 0.71 0.58 0.55  CALCIUM 9.1  --  8.6* 8.8* 9.3  MG 1.8  --   --   --   --   AST 34  --   --   --  18  ALT 15  --   --   --  13*  ALKPHOS 61   --   --   --  63  BILITOT 0.8  --   --   --  0.8   ------------------------------------------------------------------------------------------------------------------ estimated creatinine clearance is 85.1 mL/min (by C-G formula based on SCr of 0.55 mg/dL). ------------------------------------------------------------------------------------------------------------------ No results for input(s): HGBA1C in the last 72 hours. ------------------------------------------------------------------------------------------------------------------ No results for input(s): CHOL, HDL, LDLCALC, TRIG, CHOLHDL, LDLDIRECT in the last 72 hours. ------------------------------------------------------------------------------------------------------------------ No results for input(s): TSH, T4TOTAL, T3FREE, THYROIDAB in the last 72 hours.  Invalid input(s): FREET3 ------------------------------------------------------------------------------------------------------------------ No results for input(s): VITAMINB12, FOLATE, FERRITIN, TIBC, IRON, RETICCTPCT in the last 72 hours.  Coagulation profile  Recent Labs Lab 04/23/16 0111 04/23/16 1829  INR 1.05 1.14    No results for input(s): DDIMER in the last 72 hours.  Cardiac Enzymes No results for input(s): CKMB, TROPONINI, MYOGLOBIN in the last 168 hours.  Invalid input(s): CK ------------------------------------------------------------------------------------------------------------------ Invalid input(s): POCBNP   CBG: No results for input(s): GLUCAP in the last 168 hours.     Studies: Dg Chest 2 View  Result Date: 04/24/2016 CLINICAL DATA:  Right side chest pain EXAM: CHEST  2 VIEW COMPARISON:  None. FINDINGS: Cardiomediastinal silhouette is unremarkable. There is levoscoliosis mid thoracic spine. No acute infiltrate or pulmonary edema. Mild hyperinflation. IMPRESSION: No active cardiopulmonary disease. Mild hyperinflation. Levoscoliosis mid  thoracic spine. Electronically Signed   By: Lahoma Crocker M.D.   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 Mid right back pain Subsequent encounter. EXAM: MRI THORACIC AND LUMBAR SPINE WITHOUT CONTRAST TECHNIQUE: Multiplanar and multiecho pulse sequences of the thoracic and lumbar spine were obtained without intravenous contrast. COMPARISON:  CT angiogram abdomen and pelvis 04/23/2016. FINDINGS: Exam is motion degraded. MRI THORACIC SPINE FINDINGS Alignment:  Scoliosis thoracic spine convex left. Vertebrae: No worrisome abnormality. Cord: No focal cord signal abnormality. Abnormal configuration of the dorsal aspect of the cord at the T5 level. Possibility of a small arachnoid  cyst causing mild anterior displacement of the cord is raised. This is difficult to discern with certainty. This does not have signal characteristics of acute hemorrhage. Loculated cerebral spinal fluid at this level from prior insult is also consideration. Scoliosis may contribute to this appearance. Paraspinal and other soft tissues: No worrisome abnormality. Top-normal size thyroid gland. Cervical spondylotic changes C3-4 thru C5-6 incompletely assessed. Disc levels: T1-2:  Mild left facet degenerative changes. T2-3:  Mild left facet degenerative changes. T3-4:  Mild rotation. T4-5:  Mild rotation.  Minimal spur greater to the right. T5-6:  Minimal facet degenerative changes. T6-7:  Minimal spur greater to the right. T7-8:  Negative. T8-9:  Negative. T9-10:  Minimal spur. T10-11: Mild bulge and spur greater to left. Slight impression left ventral thecal sac. Slight rotation. Minimal facet bony overgrowth. T11-12: Mild left facet degenerative changes. Minimal rotation minimal bulge. T12-L1:  Mild facet degenerative change greater on the left. Exam is  motion degraded. MRI LUMBAR SPINE FINDINGS Segmentation:  Last fully open disc space labeled L5. Alignment:  Minimal curvature. Vertebrae:  No worrisome abnormality. Conus medullaris: Extends to the upper L2 level level and appears normal. No evidence of a epidural hematoma. Paraspinal and other soft tissues: Complex 5 cm mass superior to the bladder. This may represent hematoma or pseudoaneurysm from vascular surgery. The fact that this communicates with right femoral artery region where patient has had prior surgeries suggests this is not a primary ovarian lesion. Superimposed infection not excluded if of clinical concern. Fluid filled bowel. Gallstones. Disc levels: L1-2:  Mild facet degenerative changes. L2-3:  Minimal left facet bony overgrowth. L3-4: Minimal bulge greater laterally. Minimal facet bony overgrowth. L4-5: Moderate facet degenerative changes greater on the right. Ligamentum flavum hypertrophy. Mild bulge. No significant spinal stenosis or foraminal narrowing. L5-S1: Prominent facet degenerative changes greater on the right. Mild foraminal narrowing greater on left. No nerve root compression. No significant spinal stenosis. IMPRESSION: Exam is motion degraded. MR THORACIC SPINE IMPRESSION Scoliosis thoracic spine convex left. Vertebrae: No worrisome abnormality. Abnormal configuration of the dorsal aspect of the cord at the T5 level. Possibility of a small arachnoid cyst causing mild anterior displacement of the cord is raised. This is difficult to discern with certainty. This does not have signal characteristics of acute hemorrhage. Loculated cerebral spinal fluid at this level from prior insult is also consideration. Scoliosis and abnormal cerebral spinal fluid flow may contribute to this appearance. Minimal to mild degenerative changes most prominent T10-11 level without significant spinal stenosis. MR LUMBAR SPINE IMPRESSION No evidence of a epidural hematoma. Complex 5 cm mass superior to the  bladder. This may represent hematoma or pseudoaneurysm from vascular surgery. Fluid filled bowel. L4-5 moderate facet degenerative changes greater on the right. Ligamentum flavum hypertrophy. Mild bulge. No significant spinal stenosis or foraminal narrowing. L5-S1 prominent facet degenerative changes greater on the right. Mild foraminal narrowing greater on left. No nerve root compression. No significant spinal stenosis. Electronically Signed   By: Genia Del M.D.   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 Mid right back pain Subsequent encounter. EXAM: MRI THORACIC AND LUMBAR SPINE WITHOUT CONTRAST TECHNIQUE: Multiplanar and multiecho pulse sequences of the thoracic and lumbar spine were obtained without intravenous contrast. COMPARISON:  CT angiogram abdomen and pelvis 04/23/2016. FINDINGS: Exam is motion degraded. MRI THORACIC SPINE FINDINGS  Alignment:  Scoliosis thoracic spine convex left. Vertebrae: No worrisome abnormality. Cord: No focal cord signal abnormality. Abnormal configuration of the dorsal aspect of the cord at the T5 level. Possibility of a small arachnoid cyst causing mild anterior displacement of the cord is raised. This is difficult to discern with certainty. This does not have signal characteristics of acute hemorrhage. Loculated cerebral spinal fluid at this level from prior insult is also consideration. Scoliosis may contribute to this appearance. Paraspinal and other soft tissues: No worrisome abnormality. Top-normal size thyroid gland. Cervical spondylotic changes C3-4 thru C5-6 incompletely assessed. Disc levels: T1-2:  Mild left facet degenerative changes. T2-3:  Mild left facet degenerative changes. T3-4:  Mild rotation. T4-5:  Mild rotation.  Minimal spur greater to the right. T5-6:  Minimal  facet degenerative changes. T6-7:  Minimal spur greater to the right. T7-8:  Negative. T8-9:  Negative. T9-10:  Minimal spur. T10-11: Mild bulge and spur greater to left. Slight impression left ventral thecal sac. Slight rotation. Minimal facet bony overgrowth. T11-12: Mild left facet degenerative changes. Minimal rotation minimal bulge. T12-L1:  Mild facet degenerative change greater on the left. Exam is motion degraded. MRI LUMBAR SPINE FINDINGS Segmentation:  Last fully open disc space labeled L5. Alignment:  Minimal curvature. Vertebrae:  No worrisome abnormality. Conus medullaris: Extends to the upper L2 level level and appears normal. No evidence of a epidural hematoma. Paraspinal and other soft tissues: Complex 5 cm mass superior to the bladder. This may represent hematoma or pseudoaneurysm from vascular surgery. The fact that this communicates with right femoral artery region where patient has had prior surgeries suggests this is not a primary ovarian lesion. Superimposed infection not excluded if of clinical concern. Fluid filled bowel. Gallstones. Disc levels: L1-2:  Mild facet degenerative changes. L2-3:  Minimal left facet bony overgrowth. L3-4: Minimal bulge greater laterally. Minimal facet bony overgrowth. L4-5: Moderate facet degenerative changes greater on the right. Ligamentum flavum hypertrophy. Mild bulge. No significant spinal stenosis or foraminal narrowing. L5-S1: Prominent facet degenerative changes greater on the right. Mild foraminal narrowing greater on left. No nerve root compression. No significant spinal stenosis. IMPRESSION: Exam is motion degraded. MR THORACIC SPINE IMPRESSION Scoliosis thoracic spine convex left. Vertebrae: No worrisome abnormality. Abnormal configuration of the dorsal aspect of the cord at the T5 level. Possibility of a small arachnoid cyst causing mild anterior displacement of the cord is raised. This is difficult to discern with certainty. This does not have signal  characteristics of acute hemorrhage. Loculated cerebral spinal fluid at this level from prior insult is also consideration. Scoliosis and abnormal cerebral spinal fluid flow may contribute to this appearance. Minimal to mild degenerative changes most prominent T10-11 level without significant spinal stenosis. MR LUMBAR SPINE IMPRESSION No evidence of a epidural hematoma. Complex 5 cm mass superior to the bladder. This may represent hematoma or pseudoaneurysm from vascular surgery. Fluid filled bowel. L4-5 moderate facet degenerative changes greater on the right. Ligamentum flavum hypertrophy. Mild bulge. No significant spinal stenosis or foraminal narrowing. L5-S1 prominent facet degenerative changes greater on the right. Mild foraminal narrowing greater on left. No nerve root compression. No significant spinal stenosis. Electronically Signed   By: Genia Del M.D.   On: 04/25/2016 13:59      No results found for: HGBA1C Lab Results  Component Value Date   CREATININE 0.55 04/25/2016       Scheduled Meds: . sodium chloride  10 mL/hr Intravenous Once  . amLODipine  10 mg Oral Daily  .  cefTRIAXone (ROCEPHIN)  IV  1 g Intravenous Q24H  . feeding supplement  1 Container Oral TID BM  .  HYDROmorphone (DILAUDID) injection  2 mg Intravenous Once  . metoprolol tartrate  12.5 mg Oral BID  . pantoprazole  40 mg Intravenous Q12H  . sodium chloride flush  10-40 mL Intracatheter Q12H   Continuous Infusions: . sodium chloride 100 mL/hr (04/26/16 0918)  . dextrose 5 % and 0.45% NaCl Stopped (04/23/16 2111)     LOS: 3 days    Time spent: >30 MINS    [facility] Pager [phone]. If 7PM-7AM, please contact night-coverage at [email or web address], password [facility] 04/26/2016, 10:58 AM  LOS: 3 days

## 2016-04-26 NOTE — Progress Notes (Signed)
Report called to Cardell Peach, Therapist, sports for [employer].  Patient assisted to pack her belongings to be moved via wheelchair.

## 2016-04-27 LAB — CBC
HCT: 24.1 % — ABNORMAL LOW (ref 36.0–46.0)
Hemoglobin: 8.1 g/dL — ABNORMAL LOW (ref 12.0–15.0)
MCH: 31.9 pg (ref 26.0–34.0)
MCHC: 33.6 g/dL (ref 30.0–36.0)
MCV: 94.9 fL (ref 78.0–100.0)
PLATELETS: 308 10*3/uL (ref 150–400)
RBC: 2.54 MIL/uL — AB (ref 3.87–5.11)
RDW: 18.3 % — ABNORMAL HIGH (ref 11.5–15.5)
WBC: 9.2 10*3/uL (ref 4.0–10.5)

## 2016-04-27 LAB — H. PYLORI ANTIBODY, IGG: H Pylori IgG: <0.9 U/mL (ref 0.0–0.8)

## 2016-04-27 LAB — COMPREHENSIVE METABOLIC PANEL
ALBUMIN: 3.1 g/dL — AB (ref 3.5–5.0)
ALT: 10 U/L — AB (ref 14–54)
AST: 15 U/L (ref 15–41)
Alkaline Phosphatase: 59 U/L (ref 38–126)
Anion gap: 7 (ref 5–15)
CHLORIDE: 104 mmol/L (ref 101–111)
CO2: 25 mmol/L (ref 22–32)
CREATININE: 0.59 mg/dL (ref 0.44–1.00)
Calcium: 8.9 mg/dL (ref 8.9–10.3)
GFR calc Af Amer: >60 mL/min (ref >60)
GLUCOSE: 108 mg/dL — AB (ref 65–99)
Potassium: 3.2 mmol/L — ABNORMAL LOW (ref 3.5–5.1)
Sodium: 136 mmol/L (ref 135–145)
Total Bilirubin: 0.4 mg/dL (ref 0.3–1.2)
Total Protein: 5.8 g/dL — ABNORMAL LOW (ref 6.5–8.1)

## 2016-04-27 MED ORDER — CYCLOBENZAPRINE HCL 10 MG PO TABS: 10.0000 mg | ORAL_TABLET | Freq: Three times a day (TID) | ORAL | 0 refills | Status: DC | PRN

## 2016-04-27 MED ORDER — SUCRALFATE 1 GM/10ML PO SUSP: 1.0000 g | Freq: Three times a day (TID) | ORAL | 0 refills | Status: DC

## 2016-04-27 MED ORDER — CEFDINIR 300 MG PO CAPS: 300.0000 mg | ORAL_CAPSULE | Freq: Two times a day (BID) | ORAL | 0 refills | Status: DC

## 2016-04-27 MED ORDER — HYDROCODONE-ACETAMINOPHEN 5-325 MG PO TABS: 1.0000 | ORAL_TABLET | ORAL | 0 refills | Status: DC | PRN

## 2016-04-27 MED ORDER — PANTOPRAZOLE SODIUM 40 MG PO TBEC
40.0000 mg | DELAYED_RELEASE_TABLET | Freq: Two times a day (BID) | ORAL | Status: DC
  Administered 2016-04-27 – 2016-04-28 (×2): 40 mg via ORAL
  Filled 2016-04-27 (×2): qty 1

## 2016-04-27 MED ORDER — ASPIRIN EC 81 MG PO TBEC: 81.0000 mg | DELAYED_RELEASE_TABLET | Freq: Every day | ORAL | 0 refills | Status: DC

## 2016-04-27 MED ORDER — PANTOPRAZOLE SODIUM 40 MG PO TBEC
40.0000 mg | DELAYED_RELEASE_TABLET | Freq: Two times a day (BID) | ORAL | 2 refills | Status: DC
Start: 2016-04-27 — End: 2016-04-28

## 2016-04-27 MED ORDER — CLOPIDOGREL BISULFATE 75 MG PO TABS: 75.0000 mg | ORAL_TABLET | Freq: Every day | ORAL | 0 refills | Status: DC

## 2016-04-27 MED ORDER — FERROUS SULFATE 300 (60 FE) MG/5ML PO SYRP: 300.0000 mg | ORAL_SOLUTION | Freq: Every day | ORAL | 3 refills | Status: DC

## 2016-04-27 MED ORDER — ALUM & MAG HYDROXIDE-SIMETH 200-200-20 MG/5ML PO SUSP
30.0000 mL | Freq: Four times a day (QID) | ORAL | Status: DC | PRN
  Administered 2016-04-27: 30 mL via ORAL
  Filled 2016-04-27: qty 30

## 2016-04-27 MED ORDER — ONDANSETRON HCL 4 MG PO TABS: 4.0000 mg | ORAL_TABLET | Freq: Four times a day (QID) | ORAL | 0 refills | Status: DC | PRN

## 2016-04-27 NOTE — Evaluation (Signed)
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 Pt s/p upper and lower GI.  Clinical Impression  Pt is at or close to baseline functioning and should be safe at home. There are no further acute PT needs.  Will sign off at this time.     Follow Up Recommendations No PT follow up    Equipment Recommendations       Recommendations for Other Services       Precautions / Restrictions Precautions Precautions: None      Mobility  Bed Mobility Overal bed mobility: Independent                Transfers Overall transfer level: Independent                  Ambulation/Gait Ambulation/Gait assistance: Independent Ambulation Distance (Feet): 500 Feet Assistive device: None Gait Pattern/deviations: WFL(Within Functional Limits)   Gait velocity interpretation: >2.62 ft/sec, indicative of independent community ambulator General Gait Details: steady and fluid, no deviations with moderate challenges from DGI  Stairs Stairs: Yes Stairs assistance: Independent Stair Management: No rails;Alternating pattern;Forwards Number of Stairs: 4    Wheelchair Mobility    Modified Rankin (Stroke Patients Only)       Balance Overall balance assessment: Independent                                           Pertinent Vitals/Pain Pain Assessment: No/denies pain    Home Living Family/patient expects to be discharged to:: Private residence Living Arrangements: Other relatives Available Help at Discharge: Family Type of Home: House Home Access: Stairs to enter     Home Layout: One level Home Equipment: None      Prior Function Level of  Independence: Independent               Hand Dominance        Extremity/Trunk Assessment   Upper Extremity Assessment: Overall WFL for tasks assessed           Lower Extremity Assessment: Overall WFL for tasks assessed      Cervical / Trunk Assessment: Normal  Communication   Communication: No difficulties  Cognition Arousal/Alertness: Awake/alert Behavior During Therapy: WFL for tasks assessed/performed Overall Cognitive Status: Within Functional Limits for tasks assessed                      General Comments      Exercises     Assessment/Plan    PT Assessment Patent does not need any further PT services  PT Problem List            PT Treatment Interventions      PT Goals (Current goals can be found in the Care Plan section)       Frequency     Barriers to discharge        Co-evaluation               End of Session   Activity Tolerance: Patient tolerated treatment well Patient left: in bed;with call bell/phone within reach Nurse Communication: Mobility status  Time: [id number] PT Time Calculation (min) (ACUTE ONLY): 31 min   Charges:   PT Evaluation $PT Eval Low Complexity: 1 Procedure PT Treatments $Gait Training: 8-22 mins   PT G Codes:        Jaimeson Gopal, Tessie Fass 04/27/2016, 3:28 PM 04/27/2016  Donnella Sham, [place] [phone]  (pager)

## 2016-04-27 NOTE — Progress Notes (Signed)
OT Screen  Patient Details Name: Nicolas Eisenhauer MRN: [id number] DOB: Sep 07, 1970    No acute OT needs identified. Pt appears to be at baseline. Screened and discharged. Please send text page to OT services if any questions, concerns, or with new orders: [phone] OR call office at [phone]. Thank you for the order.    Phineas Semen, MS, OTR/L 04/27/2016, 4:56 PM

## 2016-04-27 NOTE — Progress Notes (Signed)
[facility] PROGRESS NOTE  Lenice Schmuhl [id number] DOB: 1970/12/19 DOA: 04/23/2016   PCP: No PCP Per Patient     Assessment/Plan: Principal Problem:   Dissection of right iliac artery (HCC) Active Problems:   Anemia, blood loss   Hyponatremia   Lactic acidosis   Gastrointestinal hemorrhage   Back pain   Intra-abdominal hematoma   Duodenal ulcer with hemorrhage   45 y.o. female from [place] who presented to ED around midnight with weakness and vomiting. Patient was in good health until late September when she was hospitalized three times. Patient presented to [facility] in [place] in September with abdominal pain, she underwent SMA stenting for stenosis. Started on plavix post-procedure.  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 Following that she developed a right groin hematoma (surgically evacuated ) and a retroperitoneal hematoma. Following all this patient was admitted a third time with constipation  / impaction. After bowel purge patient felt much better. She did require blood transfusion during two of the recent admissions. Patient had not passed any blood, stools were dark but not black. There was no mention of GI bleeding during any of those admission.  Yesterday she became extremely weak with nausea and loose nonbloody stool. She reports 2 syncopal episodes at home. Patient went to [facility] ED earlier this am.  Hgb 6-7. CTA showed hematoma extending from right inguinal and right iliac vessels into the pelvis, likely subacute, patient transferred to [facility] in case surgical intervention was needed. Upon arriving to [facility] patient had a large dark bloody BM  Assessment and plan painless hematochezia on plavix and ASA. Hgb down a couple of grams since Oct 8th. 8.2 >>>> 6.8  , now 9.1>7.8 >8.1  after 2 units. No further episodes of bleeding  Status post hematochezia 10/20 Plavix on hold, GI recommends to  keep this on hold EGD showed nonbleeding duodenal ulcer, H. pylori IgG serology pending Colonoscopy -nonbleeding internal hemorrhoids-GI recommends repeat colonoscopy in one year tagged RBC studies negative Monitor CBC closely, if okay with GI may be able to  start   Plavix in 10 days per GI Discussed with vascular surgery in [place] ,Dr Tommie Ard, he said its ok to leave patient off aspirin in the setting of her ulcer and continue plavix alone  Started on Carafate for heartburn, improved     Anemia of acute blood loss. Received several units of blood in [place] in Sept following vascular procedures complicated by right groin and retroperitoneal hematoma. Hgb low 8 range upon hospital discharge in [place] around Oct 8th. Presented with a hemoglobin of 6.7 ( Transfused 2 units of blood), hemoglobin now trending down [id number]   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 Following this she developed a right groin hematoma (requring surgical evacuation) and a right retroperitoneal bleed).  Presented to [facility] ED  with weakness, CTA suggested dissection of right external iliac artery. Vascular Surgery recommends conservative measures for now, to continue antiplatelet therapy, which is limited by her GI bleeding issues.  Vascular surgery following If she required IR embolization, it would be beneficial to evaluate her right iliac and femoral arteries She also has RP  hematoma that appears to be stable Resume plavix as above .   Trichomonas UTI, leukocytosis/Proteus mirabilis UTI - Flagyl 2 gm once given in ER -Check for chlamydia/gonorrhea ,  RPR, HIV Continue Rocephin IV for UTI,omnicef at DC   Wound right groin - as a result of procedures - cont dressing per patient- will also consult wound care  H/o HTN - cont Amlodipine and Toprol (12.5 mg  daily)  Complaint of always being tachycardic-related to her GI bleeding/anemia TSH 0.7   Mid right back pain  Due to 8.2 x8.2 cm loculated hematoma extending from the right inguinal and right iliac vessels into the pelvis above the urinary bladder,likely subacute patient also has has scoliosis midthoracic spine MRI consistent with scoliosis, degenerative changes, without significant spinal stenosis, no.nerve root compression Continue  Robaxin as needed for back pain  DVT prophylaxsis SCDs  Code Status:  Full code    Family Communication: Discussed in detail with the patient, all imaging results, lab results explained to the patient   Disposition Plan: Transfer to telemetry, PT OT consultation      Consultants:  GI  Vascular surgery  Procedures:  None  Antibiotics: Anti-infectives    Start     Dose/Rate Route Frequency Ordered Stop   04/27/16 0000  cefdinir (OMNICEF) 300 MG capsule     300 mg Oral 2 times daily 04/27/16 0956 05/04/16 2359   04/24/16 2000  cefTRIAXone (ROCEPHIN) 1 g in dextrose 5 % 50 mL IVPB     1 g 100 mL/hr over 30 Minutes Intravenous Every 24 hours 04/24/16 1920     04/23/16 0900  metroNIDAZOLE (FLAGYL) IVPB 2 g  Status:  Discontinued     2 g 400 mL/hr over 60 Minutes Intravenous  Once 04/23/16 0854 04/23/16 1033   04/23/16 0645  cefTRIAXone (ROCEPHIN) 1 g in dextrose 5 % 50 mL IVPB  Status:  Discontinued     1 g 100 mL/hr over 30 Minutes Intravenous  Once 04/23/16 0640 04/23/16 0856   04/23/16 0515  metroNIDAZOLE (FLAGYL) tablet 2,000 mg     2,000 mg Oral  Once 04/23/16 0510 04/23/16 0725         HPI/Subjective: Feeling better, heartburn improved   Objective: Vitals:   04/26/16 1524 04/26/16 2157 04/27/16 0657 04/27/16 0922  BP: (!) 149/84 (!) 155/85 102/70 132/79  Pulse: 90 97 88 87  Resp: 20 16 18    Temp: 98.6 F (37 C) 98.8 F (37.1 C) 98.4 F (36.9 C)   TempSrc: Oral Oral Oral   SpO2: 97% 100% 100% (!) 85%  Weight:       Height:        Intake/Output Summary (Last 24 hours) at 04/27/16 1202 Last data filed at 04/26/16 1243  Gross per 24 hour  Intake               10 ml  Output                0 ml  Net               10 ml    Exam:  Examination:  General exam: Appears calm and comfortable  Respiratory system: Clear to auscultation. Respiratory effort normal. Cardiovascular system: S1 & S2 heard, RRR. No JVD, murmurs, rubs, gallops or clicks. No pedal edema. Gastrointestinal system: Abdomen is nondistended, soft and nontender. No organomegaly or masses felt. Normal bowel sounds heard. Central nervous system: Alert and oriented. No focal neurological deficits. Extremities: Symmetric 5 x 5 power. Skin: No rashes, lesions or ulcers Psychiatry: Judgement and insight appear normal. Mood & affect appropriate.     Data Reviewed: I have personally reviewed following  labs and imaging studies  Micro Results Recent Results (from the past 240 hour(s))  Urine culture     Status: Abnormal   Collection Time: 04/23/16  4:29 AM  Result Value Ref Range Status   Specimen Description URINE, CLEAN CATCH  Final   Special Requests NONE  Final   Culture >=100,000 COLONIES/mL PROTEUS MIRABILIS (A)  Final   Report Status 04/25/2016 FINAL  Final   Organism ID, Bacteria PROTEUS MIRABILIS (A)  Final      Susceptibility   Proteus mirabilis - MIC*    AMPICILLIN <=2 SENSITIVE Sensitive     CEFAZOLIN <=4 SENSITIVE Sensitive     CEFTRIAXONE <=1 SENSITIVE Sensitive     CIPROFLOXACIN <=0.25 SENSITIVE Sensitive     GENTAMICIN <=1 SENSITIVE Sensitive     IMIPENEM 2 SENSITIVE Sensitive     NITROFURANTOIN 128 RESISTANT Resistant     TRIMETH/SULFA <=20 SENSITIVE Sensitive     AMPICILLIN/SULBACTAM <=2 SENSITIVE Sensitive     PIP/TAZO <=4 SENSITIVE Sensitive     * >=100,000 COLONIES/mL PROTEUS MIRABILIS  MRSA PCR Screening     Status: None   Collection Time: 04/23/16 10:39 AM  Result Value Ref Range Status   MRSA by PCR  NEGATIVE NEGATIVE Final    Comment:        The GeneXpert MRSA Assay (FDA approved for NASAL specimens only), is one component of a comprehensive MRSA colonization surveillance program. It is not intended to diagnose MRSA infection nor to guide or monitor treatment for MRSA infections.     Radiology Reports Dg Chest 2 View  Result Date: 04/24/2016 CLINICAL DATA:  Right side chest pain EXAM: CHEST  2 VIEW COMPARISON:  None. FINDINGS: Cardiomediastinal silhouette is unremarkable. There is levoscoliosis mid thoracic spine. No acute infiltrate or pulmonary edema. Mild hyperinflation. IMPRESSION: No active cardiopulmonary disease. Mild hyperinflation. Levoscoliosis mid thoracic spine. Electronically Signed   By: Lahoma Crocker M.D.   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 Mid right back pain Subsequent encounter. EXAM: MRI THORACIC AND LUMBAR SPINE WITHOUT CONTRAST TECHNIQUE: Multiplanar and multiecho pulse sequences of the thoracic and lumbar spine were obtained without intravenous contrast. COMPARISON:  CT angiogram abdomen and pelvis 04/23/2016. FINDINGS: Exam is motion degraded. MRI THORACIC SPINE FINDINGS Alignment:  Scoliosis thoracic spine convex left. Vertebrae: No worrisome abnormality. Cord: No focal cord signal abnormality. Abnormal configuration of the dorsal aspect of the cord at the T5 level. Possibility of a small arachnoid cyst causing mild anterior displacement of the cord is raised. This is difficult to discern with certainty. This does not have signal characteristics of acute hemorrhage. Loculated cerebral spinal fluid at this level from prior insult is also consideration. Scoliosis may contribute to this appearance. Paraspinal and other soft tissues: No worrisome abnormality. Top-normal size  thyroid gland. Cervical spondylotic changes C3-4 thru C5-6 incompletely assessed. Disc levels: T1-2:  Mild left facet degenerative changes. T2-3:  Mild left facet degenerative changes. T3-4:  Mild rotation. T4-5:  Mild rotation.  Minimal spur greater to the right. T5-6:  Minimal facet degenerative changes. T6-7:  Minimal spur greater to the right. T7-8:  Negative. T8-9:  Negative. T9-10:  Minimal spur. T10-11: Mild bulge and spur greater to left. Slight impression left ventral thecal sac. Slight rotation. Minimal facet bony overgrowth. T11-12: Mild left facet degenerative changes. Minimal rotation minimal bulge.  T12-L1:  Mild facet degenerative change greater on the left. Exam is motion degraded. MRI LUMBAR SPINE FINDINGS Segmentation:  Last fully open disc space labeled L5. Alignment:  Minimal curvature. Vertebrae:  No worrisome abnormality. Conus medullaris: Extends to the upper L2 level level and appears normal. No evidence of a epidural hematoma. Paraspinal and other soft tissues: Complex 5 cm mass superior to the bladder. This may represent hematoma or pseudoaneurysm from vascular surgery. The fact that this communicates with right femoral artery region where patient has had prior surgeries suggests this is not a primary ovarian lesion. Superimposed infection not excluded if of clinical concern. Fluid filled bowel. Gallstones. Disc levels: L1-2:  Mild facet degenerative changes. L2-3:  Minimal left facet bony overgrowth. L3-4: Minimal bulge greater laterally. Minimal facet bony overgrowth. L4-5: Moderate facet degenerative changes greater on the right. Ligamentum flavum hypertrophy. Mild bulge. No significant spinal stenosis or foraminal narrowing. L5-S1: Prominent facet degenerative changes greater on the right. Mild foraminal narrowing greater on left. No nerve root compression. No significant spinal stenosis. IMPRESSION: Exam is motion degraded. MR THORACIC SPINE IMPRESSION Scoliosis thoracic spine convex  left. Vertebrae: No worrisome abnormality. Abnormal configuration of the dorsal aspect of the cord at the T5 level. Possibility of a small arachnoid cyst causing mild anterior displacement of the cord is raised. This is difficult to discern with certainty. This does not have signal characteristics of acute hemorrhage. Loculated cerebral spinal fluid at this level from prior insult is also consideration. Scoliosis and abnormal cerebral spinal fluid flow may contribute to this appearance. Minimal to mild degenerative changes most prominent T10-11 level without significant spinal stenosis. MR LUMBAR SPINE IMPRESSION No evidence of a epidural hematoma. Complex 5 cm mass superior to the bladder. This may represent hematoma or pseudoaneurysm from vascular surgery. Fluid filled bowel. L4-5 moderate facet degenerative changes greater on the right. Ligamentum flavum hypertrophy. Mild bulge. No significant spinal stenosis or foraminal narrowing. L5-S1 prominent facet degenerative changes greater on the right. Mild foraminal narrowing greater on left. No nerve root compression. No significant spinal stenosis. Electronically Signed   By: Genia Del M.D.   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 Mid right back pain Subsequent encounter. EXAM: MRI THORACIC AND LUMBAR SPINE WITHOUT CONTRAST TECHNIQUE: Multiplanar and multiecho pulse sequences of the thoracic and lumbar spine were obtained without intravenous contrast. COMPARISON:  CT angiogram abdomen and pelvis 04/23/2016. FINDINGS: Exam is motion degraded. MRI THORACIC SPINE FINDINGS Alignment:  Scoliosis thoracic spine convex left. Vertebrae: No worrisome abnormality. Cord: No focal cord signal abnormality. Abnormal configuration of the dorsal aspect of the cord at the T5 level.  Possibility of a small arachnoid cyst causing mild anterior displacement of the cord is raised. This is difficult to discern with certainty. This does not have signal characteristics of acute hemorrhage. Loculated cerebral spinal fluid at this level from prior insult is also consideration. Scoliosis may contribute to this appearance. Paraspinal and other soft tissues: No worrisome abnormality. Top-normal size thyroid gland. Cervical spondylotic changes C3-4 thru C5-6 incompletely assessed. Disc levels: T1-2:  Mild left facet degenerative changes. T2-3:  Mild left facet degenerative changes. T3-4:  Mild rotation. T4-5:  Mild rotation.  Minimal spur greater to the right. T5-6:  Minimal facet degenerative changes. T6-7:  Minimal spur greater to the right. T7-8:  Negative. T8-9:  Negative. T9-10:  Minimal spur. T10-11: Mild bulge and spur greater to left. Slight impression left ventral thecal sac. Slight rotation. Minimal facet bony overgrowth. T11-12: Mild left facet degenerative changes. Minimal rotation minimal bulge. T12-L1:  Mild facet degenerative change greater on the left. Exam is motion degraded. MRI LUMBAR SPINE FINDINGS Segmentation:  Last fully open disc space labeled L5. Alignment:  Minimal curvature. Vertebrae:  No worrisome abnormality. Conus medullaris: Extends to the upper L2 level level and appears normal. No evidence of a epidural hematoma. Paraspinal and other soft tissues: Complex 5 cm mass superior to the bladder. This may represent hematoma or pseudoaneurysm from vascular surgery. The fact that this communicates with right femoral artery region where patient has had prior surgeries suggests this is not a primary ovarian lesion. Superimposed infection not excluded if of clinical concern. Fluid filled bowel. Gallstones. Disc levels: L1-2:  Mild facet degenerative changes. L2-3:  Minimal left facet bony overgrowth. L3-4: Minimal bulge greater laterally. Minimal facet bony overgrowth. L4-5: Moderate  facet degenerative changes greater on the right. Ligamentum flavum hypertrophy. Mild bulge. No significant spinal stenosis or foraminal narrowing. L5-S1: Prominent facet degenerative changes greater on the right. Mild foraminal narrowing greater on left. No nerve root compression. No significant spinal stenosis. IMPRESSION: Exam is motion degraded. MR THORACIC SPINE IMPRESSION Scoliosis thoracic spine convex left. Vertebrae: No worrisome abnormality. Abnormal configuration of the dorsal aspect of the cord at the T5 level. Possibility of a small arachnoid cyst causing mild anterior displacement of the cord is raised. This is difficult to discern with certainty. This does not have signal characteristics of acute hemorrhage. Loculated cerebral spinal fluid at this level from prior insult is also consideration. Scoliosis and abnormal cerebral spinal fluid flow may contribute to this appearance. Minimal to mild degenerative changes most prominent T10-11 level without significant spinal stenosis. MR LUMBAR SPINE IMPRESSION No evidence of a epidural hematoma. Complex 5 cm mass superior to the bladder. This may represent hematoma or pseudoaneurysm from vascular surgery. Fluid filled bowel. L4-5 moderate facet degenerative changes greater on the right. Ligamentum flavum hypertrophy. Mild bulge. No significant spinal stenosis or foraminal narrowing. L5-S1 prominent facet degenerative changes greater on the right. Mild foraminal narrowing greater on left. No nerve root compression. No significant spinal stenosis. Electronically Signed   By: Genia Del M.D.   On: 04/25/2016 13:59   Nm Gi Blood Loss  Result Date: 04/24/2016 CLINICAL DATA:  45 year old female with GI bleed. EXAM: NUCLEAR MEDICINE GASTROINTESTINAL BLEEDING SCAN TECHNIQUE: Sequential abdominal images were obtained following intravenous administration of Tc-65m labeled red blood cells. RADIOPHARMACEUTICALS:  26.4 mCi Tc-64m in-vitro labeled red cells.  COMPARISON:  Abdominal CT dated 04/23/2016 FINDINGS: Cardiac blood pool as well as activity within the aorta and iliac vessels noted. There is a physiologic uptake in the liver. There is excretion of radiotracer into the bladder. No definite activity identified conforming to the bowel to suggest active GI bleed. IMPRESSION: No definite evidence of active GI bleed. Electronically Signed   By: Anner Crete M.D.   On: 04/24/2016 01:11   Ct Angio Abd/pel W And/or Wo Contrast  Result Date: 04/23/2016 CLINICAL DATA:  45 year old female with 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 Patient presenting with weakness and abdominal pain. EXAM: CTA ABDOMEN AND PELVIS wITHOUT AND WITH CONTRAST TECHNIQUE: Multidetector CT imaging of the abdomen and pelvis was performed using the standard protocol during bolus administration of intravenous contrast. Multiplanar reconstructed images and MIPs were obtained and reviewed to  evaluate the vascular anatomy. CONTRAST:  100 cc Isovue 370 COMPARISON:  None. FINDINGS: Evaluation is limited due to respiratory motion artifact. VASCULAR Aorta: There is diffuse predominantly noncalcified plaque along the course of the abdominal aorta. No aneurysmal dilatation or evidence of dissection. There is partial occlusion of the right external iliac artery with a double-lumen appearance of the distal portion of the right external iliac artery concerning for dissection, possibly iatrogenic. Evaluation of these vessels however very limited due to motion artifact. There is also partial occlusion of the right common femoral artery. There is a 8.2 x 8.2 cm walled-off high attenuating fluid collection extending from the right inguinal and right iliac area into the pelvis above the bladder most compatible with a hematoma, possibly subacute. No definite extravasation of contrast noted within this collection to suggest active bleed. Superimposed infection is not excluded. Celiac: The  origin of the celiac axis appears patent. There is an accessory left hepatic artery arising from the left gastric artery. SMA: A SMA stent is noted and appears patent. Evaluation of the stent however is limited due to respiratory motion artifact. No definite extravasation of contrast or teres stent infiltration identified. The distal SMA appears patent. Renals: Both renal arteries are patent without evidence of aneurysm, dissection, vasculitis, fibromuscular dysplasia or significant stenosis. IMA: Appears unremarkable as visualized. Veins: The IVC is grossly unremarkable. The SMV, splenic vein, and main portal vein are patent. No portal venous gas identified. Review of the MIP images confirms the above findings. NON-VASCULAR Lower chest: The visualized lung bases are clear. No intra-abdominal free air or free fluid Hepatobiliary: The liver appears unremarkable. No intrahepatic biliary ductal dilatation. Vicarious excretion of contrast into the gallbladder noted. Pancreas: Unremarkable. No pancreatic ductal dilatation or surrounding inflammatory changes. Spleen: Normal in size without focal abnormality. Adrenals/Urinary Tract: Ill-defined 1.4 cm nodularity of the left adrenal gland, indeterminate. The right adrenal gland appears unremarkable. The kidneys, visualized ureters appear unremarkable. There is diffuse thickened appearance of the bladder wall which may be secondary to inflammatory changes and hematoma within the pelvis. Correlation with urinalysis recommended to evaluate for cystitis. Stomach/Bowel: Multiple nondistended fluid filled loops of small bowel noted throughout the abdomen. Loose stool noted within the colon compatible with diarrheal state. There is no evidence of bowel obstruction. Lymphatic: No adenopathy. Reproductive: The uterus is grossly unremarkable. Other: Right inguinal subcutaneous stranding/scarring, likely related to femoral access. There is a small supraumbilical fat containing hernia.  Musculoskeletal: No acute or significant osseous findings. IMPRESSION: VASCULAR No evidence of aortic aneurysm or dissection. SMA stent appears unremarkable and patent. Double-lumen appearance of the right external iliac artery concerning for dissection, possibly iatrogenic. There is an 8.2 x 8.2 cm loculated hematoma extending from the right inguinal and right iliac vessels into the pelvis above the urinary bladder, likely subacute. No definite evidence of active bleed. Evaluation of the vasculature is limited due to motion artifact. NON-VASCULAR No acute intra-abdominal pelvic pathology. **An incidental finding of potential clinical significance has been found. A 1.4 cm probable left adrenal nodule, indeterminate. Nonemergent MRI without and with contrast is recommended for further evaluation.** Thickened appearance of the bladder wall, possibly reactive to the hematoma within the pelvis. Correlation with urinalysis recommended to exclude cystitis. These results were called by telephone at the time of interpretation on 04/23/2016 at 6:35 am to Dr. Everlene Balls , who verbally acknowledged these results. Electronically Signed   By: Anner Crete M.D.   On: 04/23/2016 06:36     CBC  Recent Labs Lab 04/23/16 0111  04/23/16 1829 04/24/16 0116 04/24/16 1116 04/25/16 0435 04/26/16 0510 04/27/16 0330  WBC 16.1*  --  12.8*  --  11.3* 10.3 9.1 9.2  HGB 6.7*  < > 8.3* 8.3* 9.1* 8.9* 7.8* 8.1*  HCT 20.1*  < > 24.4* 24.5* 26.8* 26.9* 23.9* 24.1*  PLT 400  --  252  --  279 283 276 308  MCV 98.5  --  92.1  --  92.4 94.1 95.6 94.9  MCH 32.8  --  31.3  --  31.4 31.1 31.2 31.9  MCHC 33.3  --  34.0  --  34.0 33.1 32.6 33.6  RDW 19.1*  --  19.4*  --  19.3* 19.0* 18.7* 18.3*  LYMPHSABS 2.8  --  2.2  --   --   --   --   --   MONOABS 0.9  --  0.9  --   --   --   --   --   EOSABS 0.0  --  0.1  --   --   --   --   --   BASOSABS 0.0  --  0.0  --   --   --   --   --   < > = values in this interval not  displayed.  Chemistries   Recent Labs Lab 04/23/16 0111 04/23/16 0253 04/23/16 1829 04/24/16 0116 04/25/16 0435 04/27/16 0330  NA 133* 134* 134* 136 136 136  K 4.3 3.6 3.5 3.6 3.5 3.2*  CL 101 97* 104 105 104 104  CO2 21*  --  24 24 24 25   GLUCOSE 125* 117* 107* 119* 106* 108*  BUN 21* 20 13 9  <5* <5*  CREATININE 0.75 0.60 0.71 0.58 0.55 0.59  CALCIUM 9.1  --  8.6* 8.8* 9.3 8.9  MG 1.8  --   --   --   --   --   AST 34  --   --   --  18 15  ALT 15  --   --   --  13* 10*  ALKPHOS 61  --   --   --  63 59  BILITOT 0.8  --   --   --  0.8 0.4   ------------------------------------------------------------------------------------------------------------------ estimated creatinine clearance is 85.1 mL/min (by C-G formula based on SCr of 0.59 mg/dL). ------------------------------------------------------------------------------------------------------------------ No results for input(s): HGBA1C in the last 72 hours. ------------------------------------------------------------------------------------------------------------------ No results for input(s): CHOL, HDL, LDLCALC, TRIG, CHOLHDL, LDLDIRECT in the last 72 hours. ------------------------------------------------------------------------------------------------------------------ No results for input(s): TSH, T4TOTAL, T3FREE, THYROIDAB in the last 72 hours.  Invalid input(s): FREET3 ------------------------------------------------------------------------------------------------------------------ No results for input(s): VITAMINB12, FOLATE, FERRITIN, TIBC, IRON, RETICCTPCT in the last 72 hours.  Coagulation profile  Recent Labs Lab 04/23/16 0111 04/23/16 1829  INR 1.05 1.14    No results for input(s): DDIMER in the last 72 hours.  Cardiac Enzymes No results for input(s): CKMB, TROPONINI, MYOGLOBIN in the last 168 hours.  Invalid input(s):  CK ------------------------------------------------------------------------------------------------------------------ Invalid input(s): POCBNP   CBG: No results for input(s): GLUCAP in the last 168 hours.     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 Mid right back pain Subsequent encounter. EXAM: MRI THORACIC AND LUMBAR SPINE WITHOUT CONTRAST TECHNIQUE: Multiplanar and multiecho pulse sequences of the thoracic and lumbar spine were obtained without intravenous contrast. COMPARISON:  CT angiogram  abdomen and pelvis 04/23/2016. FINDINGS: Exam is motion degraded. MRI THORACIC SPINE FINDINGS Alignment:  Scoliosis thoracic spine convex left. Vertebrae: No worrisome abnormality. Cord: No focal cord signal abnormality. Abnormal configuration of the dorsal aspect of the cord at the T5 level. Possibility of a small arachnoid cyst causing mild anterior displacement of the cord is raised. This is difficult to discern with certainty. This does not have signal characteristics of acute hemorrhage. Loculated cerebral spinal fluid at this level from prior insult is also consideration. Scoliosis may contribute to this appearance. Paraspinal and other soft tissues: No worrisome abnormality. Top-normal size thyroid gland. Cervical spondylotic changes C3-4 thru C5-6 incompletely assessed. Disc levels: T1-2:  Mild left facet degenerative changes. T2-3:  Mild left facet degenerative changes. T3-4:  Mild rotation. T4-5:  Mild rotation.  Minimal spur greater to the right. T5-6:  Minimal facet degenerative changes. T6-7:  Minimal spur greater to the right. T7-8:  Negative. T8-9:  Negative. T9-10:  Minimal spur. T10-11: Mild bulge and spur greater to left. Slight impression left ventral thecal sac. Slight rotation. Minimal facet bony overgrowth.  T11-12: Mild left facet degenerative changes. Minimal rotation minimal bulge. T12-L1:  Mild facet degenerative change greater on the left. Exam is motion degraded. MRI LUMBAR SPINE FINDINGS Segmentation:  Last fully open disc space labeled L5. Alignment:  Minimal curvature. Vertebrae:  No worrisome abnormality. Conus medullaris: Extends to the upper L2 level level and appears normal. No evidence of a epidural hematoma. Paraspinal and other soft tissues: Complex 5 cm mass superior to the bladder. This may represent hematoma or pseudoaneurysm from vascular surgery. The fact that this communicates with right femoral artery region where patient has had prior surgeries suggests this is not a primary ovarian lesion. Superimposed infection not excluded if of clinical concern. Fluid filled bowel. Gallstones. Disc levels: L1-2:  Mild facet degenerative changes. L2-3:  Minimal left facet bony overgrowth. L3-4: Minimal bulge greater laterally. Minimal facet bony overgrowth. L4-5: Moderate facet degenerative changes greater on the right. Ligamentum flavum hypertrophy. Mild bulge. No significant spinal stenosis or foraminal narrowing. L5-S1: Prominent facet degenerative changes greater on the right. Mild foraminal narrowing greater on left. No nerve root compression. No significant spinal stenosis. IMPRESSION: Exam is motion degraded. MR THORACIC SPINE IMPRESSION Scoliosis thoracic spine convex left. Vertebrae: No worrisome abnormality. Abnormal configuration of the dorsal aspect of the cord at the T5 level. Possibility of a small arachnoid cyst causing mild anterior displacement of the cord is raised. This is difficult to discern with certainty. This does not have signal characteristics of acute hemorrhage. Loculated cerebral spinal fluid at this level from prior insult is also consideration. Scoliosis and abnormal cerebral spinal fluid flow may contribute to this appearance. Minimal to mild degenerative changes most prominent  T10-11 level without significant spinal stenosis. MR LUMBAR SPINE IMPRESSION No evidence of a epidural hematoma. Complex 5 cm mass superior to the bladder. This may represent hematoma or pseudoaneurysm from vascular surgery. Fluid filled bowel. L4-5 moderate facet degenerative changes greater on the right. Ligamentum flavum hypertrophy. Mild bulge. No significant spinal stenosis or foraminal narrowing. L5-S1 prominent facet degenerative changes greater on the right. Mild foraminal narrowing greater on left. No nerve root compression. No significant spinal stenosis. Electronically Signed   By: Genia Del M.D.   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  Mid right back pain Subsequent encounter. EXAM: MRI THORACIC AND LUMBAR SPINE WITHOUT CONTRAST TECHNIQUE: Multiplanar and multiecho pulse sequences of the thoracic and lumbar spine were obtained without intravenous contrast. COMPARISON:  CT angiogram abdomen and pelvis 04/23/2016. FINDINGS: Exam is motion degraded. MRI THORACIC SPINE FINDINGS Alignment:  Scoliosis thoracic spine convex left. Vertebrae: No worrisome abnormality. Cord: No focal cord signal abnormality. Abnormal configuration of the dorsal aspect of the cord at the T5 level. Possibility of a small arachnoid cyst causing mild anterior displacement of the cord is raised. This is difficult to discern with certainty. This does not have signal characteristics of acute hemorrhage. Loculated cerebral spinal fluid at this level from prior insult is also consideration. Scoliosis may contribute to this appearance. Paraspinal and other soft tissues: No worrisome abnormality. Top-normal size thyroid gland. Cervical spondylotic changes C3-4 thru C5-6 incompletely assessed. Disc levels: T1-2:  Mild left facet degenerative  changes. T2-3:  Mild left facet degenerative changes. T3-4:  Mild rotation. T4-5:  Mild rotation.  Minimal spur greater to the right. T5-6:  Minimal facet degenerative changes. T6-7:  Minimal spur greater to the right. T7-8:  Negative. T8-9:  Negative. T9-10:  Minimal spur. T10-11: Mild bulge and spur greater to left. Slight impression left ventral thecal sac. Slight rotation. Minimal facet bony overgrowth. T11-12: Mild left facet degenerative changes. Minimal rotation minimal bulge. T12-L1:  Mild facet degenerative change greater on the left. Exam is motion degraded. MRI LUMBAR SPINE FINDINGS Segmentation:  Last fully open disc space labeled L5. Alignment:  Minimal curvature. Vertebrae:  No worrisome abnormality. Conus medullaris: Extends to the upper L2 level level and appears normal. No evidence of a epidural hematoma. Paraspinal and other soft tissues: Complex 5 cm mass superior to the bladder. This may represent hematoma or pseudoaneurysm from vascular surgery. The fact that this communicates with right femoral artery region where patient has had prior surgeries suggests this is not a primary ovarian lesion. Superimposed infection not excluded if of clinical concern. Fluid filled bowel. Gallstones. Disc levels: L1-2:  Mild facet degenerative changes. L2-3:  Minimal left facet bony overgrowth. L3-4: Minimal bulge greater laterally. Minimal facet bony overgrowth. L4-5: Moderate facet degenerative changes greater on the right. Ligamentum flavum hypertrophy. Mild bulge. No significant spinal stenosis or foraminal narrowing. L5-S1: Prominent facet degenerative changes greater on the right. Mild foraminal narrowing greater on left. No nerve root compression. No significant spinal stenosis. IMPRESSION: Exam is motion degraded. MR THORACIC SPINE IMPRESSION Scoliosis thoracic spine convex left. Vertebrae: No worrisome abnormality. Abnormal configuration of the dorsal aspect of the cord at the T5 level. Possibility of a  small arachnoid cyst causing mild anterior displacement of the cord is raised. This is difficult to discern with certainty. This does not have signal characteristics of acute hemorrhage. Loculated cerebral spinal fluid at this level from prior insult is also consideration. Scoliosis and abnormal cerebral spinal fluid flow may contribute to this appearance. Minimal to mild degenerative changes most prominent T10-11 level without significant spinal stenosis. MR LUMBAR SPINE IMPRESSION No evidence of a epidural hematoma. Complex 5 cm mass superior to the bladder. This may represent hematoma or pseudoaneurysm from vascular surgery. Fluid filled bowel. L4-5 moderate facet degenerative changes greater on the right. Ligamentum flavum hypertrophy. Mild bulge. No significant spinal stenosis or foraminal narrowing. L5-S1 prominent facet degenerative changes greater on the right. Mild foraminal narrowing greater on left. No nerve root compression. No significant spinal stenosis. Electronically Signed   By: Alcide Evener.D.  On: 04/25/2016 13:59      No results found for: HGBA1C Lab Results  Component Value Date   CREATININE 0.59 04/27/2016       Scheduled Meds: . amLODipine  10 mg Oral Daily  . cefTRIAXone (ROCEPHIN)  IV  1 g Intravenous Q24H  . feeding supplement  1 Container Oral TID BM  .  HYDROmorphone (DILAUDID) injection  2 mg Intravenous Once  . metoprolol tartrate  12.5 mg Oral BID  . pantoprazole  40 mg Intravenous Q12H  . polyethylene glycol  17 g Oral Daily  . sodium chloride flush  10-40 mL Intracatheter Q12H  . sucralfate  1 g Oral TID WC & HS   Continuous Infusions: . sodium chloride 50 mL/hr at 04/27/16 1152  . dextrose 5 % and 0.45% NaCl Stopped (04/23/16 2111)     LOS: 4 days    Time spent: >30 MINS    [facility] Pager [phone]. If 7PM-7AM, please contact night-coverage at [email or web address], password [facility] 04/27/2016, 12:02 PM  LOS: 4 days

## 2016-04-28 LAB — CBC
HCT: 24 % — ABNORMAL LOW (ref 36.0–46.0)
Hemoglobin: 7.9 g/dL — ABNORMAL LOW (ref 12.0–15.0)
MCH: 31.7 pg (ref 26.0–34.0)
MCHC: 32.9 g/dL (ref 30.0–36.0)
MCV: 96.4 fL (ref 78.0–100.0)
PLATELETS: 308 10*3/uL (ref 150–400)
RBC: 2.49 MIL/uL — ABNORMAL LOW (ref 3.87–5.11)
RDW: 18.2 % — AB (ref 11.5–15.5)
WBC: 9.4 10*3/uL (ref 4.0–10.5)

## 2016-04-28 LAB — H. PYLORI ANTIBODY, IGG: H Pylori IgG: <0.9 U/mL (ref 0.0–0.8)

## 2016-04-28 MED ORDER — CEFDINIR 300 MG PO CAPS: 300.0000 mg | ORAL_CAPSULE | Freq: Two times a day (BID) | ORAL | 0 refills | Status: AC

## 2016-04-28 MED ORDER — SUCRALFATE 1 GM/10ML PO SUSP: 1.0000 g | Freq: Three times a day (TID) | ORAL | 0 refills | Status: DC

## 2016-04-28 MED ORDER — CLOPIDOGREL BISULFATE 75 MG PO TABS: 75.0000 mg | ORAL_TABLET | Freq: Every day | ORAL | 0 refills | Status: DC

## 2016-04-28 MED ORDER — PANTOPRAZOLE SODIUM 40 MG PO TBEC: 40.0000 mg | DELAYED_RELEASE_TABLET | Freq: Two times a day (BID) | ORAL | 2 refills | Status: DC

## 2016-04-28 MED ORDER — FERROUS SULFATE 300 (60 FE) MG/5ML PO SYRP: 300.0000 mg | ORAL_SOLUTION | Freq: Every day | ORAL | 3 refills | Status: DC

## 2016-04-28 MED ORDER — METOPROLOL SUCCINATE ER 25 MG PO TB24: 12.5000 mg | ORAL_TABLET | Freq: Every day | ORAL | 1 refills | Status: DC

## 2016-04-28 MED ORDER — CYCLOBENZAPRINE HCL 10 MG PO TABS: 10.0000 mg | ORAL_TABLET | Freq: Three times a day (TID) | ORAL | 0 refills | Status: DC | PRN

## 2016-04-28 MED ORDER — PANTOPRAZOLE SODIUM 40 MG PO TBEC
40.0000 mg | DELAYED_RELEASE_TABLET | Freq: Two times a day (BID) | ORAL | 2 refills | Status: DC
Start: 2016-04-28 — End: 2016-05-31

## 2016-04-28 MED FILL — CYCLOBENZAPRINE 10 MG TAB: 10 | 10 days supply | Qty: 30 | Fill #0

## 2016-04-28 MED FILL — ?PANTOPRAZOLE SOD DR 40MG: 40 MG | 30 days supply | Qty: 60 | Fill #0

## 2016-04-28 MED FILL — ?CEFDINIR 300 MG CAPSULE: 300 MG | 7 days supply | Qty: 14 | Fill #0

## 2016-04-28 MED FILL — CARAFATE 1 GM/10 ML SUSP: 1 | 10 days supply | Qty: 420 | Fill #0

## 2016-04-28 MED FILL — ?ONDANSETRON HCL 4 MG TABLE: 4 | 5 days supply | Qty: 20 | Fill #0

## 2016-04-28 NOTE — Progress Notes (Signed)
No GI bleed throughout the night with this RN

## 2016-04-28 NOTE — Progress Notes (Signed)
Nsg Discharge Note  Admit Date:  04/23/2016 Discharge date: 04/28/2016   Michelle Joyce to be D/C'd Home per MD order.  AVS completed.  Copy for chart, and copy for patient signed, and dated. Patient/caregiver able to verbalize understanding.  Discharge Medication:   Medication List    STOP taking these medications   aspirin EC 81 MG tablet     TAKE these medications   amLODipine 10 MG tablet Commonly known as:  NORVASC Take 10 mg by mouth daily.   cefdinir 300 MG capsule Commonly known as:  OMNICEF Take 1 capsule (300 mg total) by mouth 2 (two) times daily.   clopidogrel 75 MG tablet Commonly known as:  PLAVIX Take 1 tablet (75 mg total) by mouth daily. Start taking on:  05/10/2016   cyclobenzaprine 10 MG tablet Commonly known as:  FLEXERIL Take 1 tablet (10 mg total) by mouth 3 (three) times daily as needed for muscle spasms.   ferrous sulfate 300 (60 Fe) MG/5ML syrup Take 5 mLs (300 mg total) by mouth daily.   HYDROcodone-acetaminophen 5-325 MG tablet Commonly known as:  NORCO/VICODIN Take 1 tablet by mouth every 4 (four) hours as needed for moderate pain.   metoprolol succinate 25 MG 24 hr tablet Commonly known as:  TOPROL-XL Take 0.5 tablets (12.5 mg total) by mouth daily.   ondansetron 4 MG disintegrating tablet Commonly known as:  ZOFRAN-ODT Take 4 mg by mouth every 6 (six) hours as needed for nausea or vomiting.   ondansetron 4 MG tablet Commonly known as:  ZOFRAN Take 1 tablet (4 mg total) by mouth every 6 (six) hours as needed for nausea.   pantoprazole 40 MG tablet Commonly known as:  PROTONIX Take 1 tablet (40 mg total) by mouth 2 (two) times daily.   polyethylene glycol packet Commonly known as:  MIRALAX / GLYCOLAX Take 17 g by mouth daily.   sucralfate 1 GM/10ML suspension Commonly known as:  CARAFATE Take 10 mLs (1 g total) by mouth 4 (four) times daily -  with meals and at bedtime.       Discharge Assessment: Vitals:   04/28/16 0751  04/28/16 1304  BP: 110/80 (!) 146/81  Pulse: 92 92  Resp: 16 14  Temp: 99.6 F (37.6 C) 98.6 F (37 C)   Skin clean, dry and intact without evidence of skin break down, no evidence of skin tears noted. IV catheter discontinued intact. Site without signs and symptoms of complications - no redness or edema noted at insertion site, patient denies c/o pain - only slight tenderness at site.  Dressing with slight pressure applied.  D/c Instructions-Education: Discharge instructions given to patient/family with verbalized understanding. D/c education completed with patient/family including follow up instructions, medication list, d/c activities limitations if indicated, with other d/c instructions as indicated by MD - patient able to verbalize understanding, all questions fully answered. Patient instructed to return to ED, call 911, or call MD for any changes in condition.  Patient escorted via [place], and D/C home via private auto.  Michelle Joyce Michelle Sheffield, RN 04/28/2016 3:51 PM

## 2016-04-28 NOTE — Care Management Note (Signed)
Case Management Note  Patient Details  Name: Nelly Craun MRN: [id number] Date of Birth: Nov 23, 1970  Subjective/Objective:             Provided patient with another [facility] flyer at her request. CM explained to patient that they may use the on site pharmacy to fill prescriptions given to them at discharge. Patient aware that the [facility] pharmacy will not fill narcotics or pain medications prior to the patient being seen by one of their physicians.  Patient aware that they must be seen as a patient prior to the pharmacy filling the prescriptions a second time.  Faxed all Rxs to [facility] pharmacy. Follow up appt made.   Action/Plan:   Expected Discharge Date:  04/25/16               Expected Discharge Plan:  Home/Self Care  In-House Referral:  NA  Discharge planning Services  CM Consult, [facility]  Post Acute Care Choice:  NA Choice offered to:  NA  DME Arranged:  N/A DME Agency:  NA  HH Arranged:  NA HH Agency:  NA  Status of Service:  In process, will continue to follow  If discussed at Long Length of Stay Meetings, dates discussed:    Additional Comments:  Carles Collet, RN 04/28/2016, 11:45 AM

## 2016-04-28 NOTE — Discharge Summary (Addendum)
Physician Discharge Summary  Michelle Joyce MRN: [id number] DOB/AGE: Feb 08, 1971 45 y.o.  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      Current Discharge Medication List    START taking these medications   Details  cefdinir (OMNICEF) 300 MG capsule Take 1 capsule (300 mg total) by mouth 2 (two) times daily. Qty: 14 capsule, Refills: 0    cyclobenzaprine (FLEXERIL) 10 MG tablet Take 1 tablet (10 mg total) by mouth 3 (three) times daily as needed for muscle spasms. Qty: 30 tablet, Refills: 0    ferrous sulfate 300 (60 Fe) MG/5ML syrup Take 5 mLs (300 mg total) by mouth daily. Qty: 150 mL, Refills: 3    HYDROcodone-acetaminophen (NORCO/VICODIN) 5-325 MG tablet Take 1 tablet by mouth every 4 (four) hours as needed for moderate pain. Qty: 30 tablet, Refills: 0    ondansetron (ZOFRAN) 4 MG tablet Take 1 tablet (4 mg total) by mouth every 6 (six) hours as needed for nausea. Qty: 20 tablet, Refills: 0    pantoprazole (PROTONIX) 40 MG tablet Take 1 tablet (40 mg total) by mouth 2 (two) times daily. Qty: 60 tablet, Refills: 2    sucralfate (CARAFATE) 1 GM/10ML suspension Take 10 mLs (1 g total) by mouth 4 (four) times daily -  with meals and at bedtime. Qty: 420 mL, Refills: 0      CONTINUE these medications which have CHANGED   Details        clopidogrel (PLAVIX) 75 MG tablet Take 1 tablet (75 mg total) by mouth  daily. Qty: 30 tablet, Refills: 0      CONTINUE these medications which have NOT CHANGED   Details  amLODipine (NORVASC) 10 MG tablet Take 10 mg by mouth daily.    metoprolol succinate (TOPROL-XL) 25 MG 24 hr tablet Take 12.5 mg by mouth daily.    ondansetron (ZOFRAN-ODT) 4 MG disintegrating tablet Take 4 mg by mouth every 6 (six) hours as needed for nausea or vomiting.    polyethylene glycol (MIRALAX / GLYCOLAX) packet Take 17 g by mouth daily.         Discharge Condition:  Stable   Discharge Instructions Get Medicines reviewed and adjusted: Please take all your medications with you for your next visit with your Primary MD  Please request your Primary MD to go over all hospital tests and procedure/radiological results at the follow up, please ask your Primary MD to get all Hospital records sent to his/her office.  If you experience worsening of your admission symptoms, develop shortness of breath, life threatening emergency, suicidal or homicidal thoughts you must seek medical attention immediately by calling 911 or calling your MD immediately if symptoms less severe.  You must read complete instructions/literature along with all the possible adverse reactions/side effects for all the Medicines you take and that have been prescribed to you. Take any new Medicines after you have completely understood and accpet all the possible  adverse reactions/side effects.   Do not drive when taking Pain medications.   Do not take more than prescribed Pain, Sleep and Anxiety Medications  Special Instructions: If you have smoked or chewed Tobacco in the last 2 yrs please stop smoking, stop any regular Alcohol and or any Recreational drug use.  Wear Seat belts while driving.  Please note  You were cared for by a hospitalist during your hospital stay. Once you are discharged, your primary care physician will handle any further medical issues. Please note that NO REFILLS for any discharge  medications will be authorized once you are discharged, as it is imperative that you return to your primary care physician (or establish a relationship with a primary care physician if you do not have one) for your aftercare needs so that they can reassess your need for medications and monitor your lab values.     No Known Allergies    Disposition: Home with family   Consults: * Vascular surgery Gastroenterology     Significant Diagnostic Studies:  Dg Chest 2 View  Result Date: 04/24/2016 CLINICAL DATA:  Right side chest pain EXAM: CHEST  2 VIEW COMPARISON:  None. FINDINGS: Cardiomediastinal silhouette is unremarkable. There is levoscoliosis mid thoracic spine. No acute infiltrate or pulmonary edema. Mild hyperinflation. IMPRESSION: No active cardiopulmonary disease. Mild hyperinflation. Levoscoliosis mid thoracic spine. Electronically Signed   By: Lahoma Crocker M.D.   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 Mid right back pain Subsequent encounter. EXAM: MRI THORACIC AND LUMBAR SPINE WITHOUT CONTRAST TECHNIQUE: Multiplanar and multiecho pulse sequences of the thoracic and lumbar spine were obtained without intravenous contrast. COMPARISON:  CT angiogram abdomen and pelvis 04/23/2016. FINDINGS: Exam is motion degraded. MRI THORACIC SPINE FINDINGS Alignment:  Scoliosis thoracic spine convex left. Vertebrae: No worrisome abnormality. Cord: No focal cord signal abnormality. Abnormal configuration of the dorsal aspect of the cord at the T5 level. Possibility of a small arachnoid cyst causing mild anterior displacement of the cord is raised. This is difficult to discern with certainty. This does not have signal characteristics of acute hemorrhage. Loculated cerebral spinal fluid at this level from prior  insult is also consideration. Scoliosis may contribute to this appearance. Paraspinal and other soft tissues: No worrisome abnormality. Top-normal size thyroid gland. Cervical spondylotic changes C3-4 thru C5-6 incompletely assessed. Disc levels: T1-2:  Mild left facet degenerative changes. T2-3:  Mild left facet degenerative changes. T3-4:  Mild rotation. T4-5:  Mild rotation.  Minimal spur greater to the right. T5-6:  Minimal facet degenerative changes. T6-7:  Minimal spur greater to the right. T7-8:  Negative. T8-9:  Negative. T9-10:  Minimal spur. T10-11: Mild bulge and spur greater to left. Slight impression left ventral thecal sac. Slight rotation. Minimal facet bony overgrowth. T11-12: Mild left facet degenerative changes. Minimal rotation minimal bulge. T12-L1:  Mild facet degenerative change greater on the left. Exam is motion degraded. MRI LUMBAR SPINE FINDINGS Segmentation:  Last fully open disc space labeled L5. Alignment:  Minimal curvature. Vertebrae:  No worrisome abnormality. Conus medullaris: Extends to the upper L2 level level and appears normal. No evidence of a epidural hematoma. Paraspinal and other soft tissues: Complex 5 cm mass superior to the bladder. This may represent hematoma or pseudoaneurysm from vascular surgery. The fact that this communicates with right femoral artery region where patient  has had prior surgeries suggests this is not a primary ovarian lesion. Superimposed infection not excluded if of clinical concern. Fluid filled bowel. Gallstones. Disc levels: L1-2:  Mild facet degenerative changes. L2-3:  Minimal left facet bony overgrowth. L3-4: Minimal bulge greater laterally. Minimal facet bony overgrowth. L4-5: Moderate facet degenerative changes greater on the right. Ligamentum flavum hypertrophy. Mild bulge. No significant spinal stenosis or foraminal narrowing. L5-S1: Prominent facet degenerative changes greater on the right. Mild foraminal narrowing greater on left. No  nerve root compression. No significant spinal stenosis. IMPRESSION: Exam is motion degraded. MR THORACIC SPINE IMPRESSION Scoliosis thoracic spine convex left. Vertebrae: No worrisome abnormality. Abnormal configuration of the dorsal aspect of the cord at the T5 level. Possibility of a small arachnoid cyst causing mild anterior displacement of the cord is raised. This is difficult to discern with certainty. This does not have signal characteristics of acute hemorrhage. Loculated cerebral spinal fluid at this level from prior insult is also consideration. Scoliosis and abnormal cerebral spinal fluid flow may contribute to this appearance. Minimal to mild degenerative changes most prominent T10-11 level without significant spinal stenosis. MR LUMBAR SPINE IMPRESSION No evidence of a epidural hematoma. Complex 5 cm mass superior to the bladder. This may represent hematoma or pseudoaneurysm from vascular surgery. Fluid filled bowel. L4-5 moderate facet degenerative changes greater on the right. Ligamentum flavum hypertrophy. Mild bulge. No significant spinal stenosis or foraminal narrowing. L5-S1 prominent facet degenerative changes greater on the right. Mild foraminal narrowing greater on left. No nerve root compression. No significant spinal stenosis. Electronically Signed   By: Genia Del M.D.   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 Mid right back pain Subsequent encounter. EXAM: MRI THORACIC AND LUMBAR SPINE WITHOUT CONTRAST TECHNIQUE: Multiplanar and multiecho pulse sequences of the thoracic and lumbar spine were obtained without intravenous contrast. COMPARISON:  CT angiogram abdomen and pelvis 04/23/2016. FINDINGS: Exam is motion degraded. MRI THORACIC SPINE FINDINGS Alignment:  Scoliosis thoracic spine convex left.  Vertebrae: No worrisome abnormality. Cord: No focal cord signal abnormality. Abnormal configuration of the dorsal aspect of the cord at the T5 level. Possibility of a small arachnoid cyst causing mild anterior displacement of the cord is raised. This is difficult to discern with certainty. This does not have signal characteristics of acute hemorrhage. Loculated cerebral spinal fluid at this level from prior insult is also consideration. Scoliosis may contribute to this appearance. Paraspinal and other soft tissues: No worrisome abnormality. Top-normal size thyroid gland. Cervical spondylotic changes C3-4 thru C5-6 incompletely assessed. Disc levels: T1-2:  Mild left facet degenerative changes. T2-3:  Mild left facet degenerative changes. T3-4:  Mild rotation. T4-5:  Mild rotation.  Minimal spur greater to the right. T5-6:  Minimal facet degenerative changes. T6-7:  Minimal spur greater to the right. T7-8:  Negative. T8-9:  Negative. T9-10:  Minimal spur. T10-11: Mild bulge and spur greater to left. Slight impression left ventral thecal sac. Slight rotation. Minimal facet bony overgrowth. T11-12: Mild left facet degenerative changes. Minimal rotation minimal bulge. T12-L1:  Mild facet degenerative change greater on the left. Exam is motion degraded. MRI LUMBAR SPINE FINDINGS Segmentation:  Last fully open disc space labeled L5. Alignment:  Minimal curvature. Vertebrae:  No worrisome abnormality. Conus medullaris: Extends to the upper L2 level level and appears normal. No evidence of a epidural  hematoma. Paraspinal and other soft tissues: Complex 5 cm mass superior to the bladder. This may represent hematoma or pseudoaneurysm from vascular surgery. The fact that this communicates with right femoral artery region where patient has had prior surgeries suggests this is not a primary ovarian lesion. Superimposed infection not excluded if of clinical concern. Fluid filled bowel. Gallstones. Disc levels: L1-2:  Mild facet  degenerative changes. L2-3:  Minimal left facet bony overgrowth. L3-4: Minimal bulge greater laterally. Minimal facet bony overgrowth. L4-5: Moderate facet degenerative changes greater on the right. Ligamentum flavum hypertrophy. Mild bulge. No significant spinal stenosis or foraminal narrowing. L5-S1: Prominent facet degenerative changes greater on the right. Mild foraminal narrowing greater on left. No nerve root compression. No significant spinal stenosis. IMPRESSION: Exam is motion degraded. MR THORACIC SPINE IMPRESSION Scoliosis thoracic spine convex left. Vertebrae: No worrisome abnormality. Abnormal configuration of the dorsal aspect of the cord at the T5 level. Possibility of a small arachnoid cyst causing mild anterior displacement of the cord is raised. This is difficult to discern with certainty. This does not have signal characteristics of acute hemorrhage. Loculated cerebral spinal fluid at this level from prior insult is also consideration. Scoliosis and abnormal cerebral spinal fluid flow may contribute to this appearance. Minimal to mild degenerative changes most prominent T10-11 level without significant spinal stenosis. MR LUMBAR SPINE IMPRESSION No evidence of a epidural hematoma. Complex 5 cm mass superior to the bladder. This may represent hematoma or pseudoaneurysm from vascular surgery. Fluid filled bowel. L4-5 moderate facet degenerative changes greater on the right. Ligamentum flavum hypertrophy. Mild bulge. No significant spinal stenosis or foraminal narrowing. L5-S1 prominent facet degenerative changes greater on the right. Mild foraminal narrowing greater on left. No nerve root compression. No significant spinal stenosis. Electronically Signed   By: Genia Del M.D.   On: 04/25/2016 13:59   Nm Gi Blood Loss  Result Date: 04/24/2016 CLINICAL DATA:  45 year old female with GI bleed. EXAM: NUCLEAR MEDICINE GASTROINTESTINAL BLEEDING SCAN TECHNIQUE: Sequential abdominal images were  obtained following intravenous administration of Tc-4mlabeled red blood cells. RADIOPHARMACEUTICALS:  26.4 mCi Tc-938mn-vitro labeled red cells. COMPARISON:  Abdominal CT dated 04/23/2016 FINDINGS: Cardiac blood pool as well as activity within the aorta and iliac vessels noted. There is a physiologic uptake in the liver. There is excretion of radiotracer into the bladder. No definite activity identified conforming to the bowel to suggest active GI bleed. IMPRESSION: No definite evidence of active GI bleed. Electronically Signed   By: ArAnner Crete.D.   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 Patient presenting with weakness and abdominal pain. EXAM: CTA ABDOMEN AND PELVIS wITHOUT AND WITH CONTRAST TECHNIQUE: Multidetector CT imaging of the abdomen and pelvis was performed using the standard protocol during bolus administration of intravenous contrast. Multiplanar reconstructed images and MIPs were obtained and reviewed to evaluate the vascular anatomy. CONTRAST:  100 cc Isovue 370 COMPARISON:  None. FINDINGS: Evaluation is limited due to respiratory motion artifact. VASCULAR Aorta: There is diffuse predominantly noncalcified plaque along the course of the abdominal aorta. No aneurysmal dilatation or evidence of dissection. There is partial occlusion of the right external iliac artery with a double-lumen appearance of the distal portion of the right external iliac artery concerning for dissection, possibly iatrogenic. Evaluation of these vessels however very limited due to motion artifact. There is also partial  occlusion of the right common femoral artery. There is a 8.2 x 8.2 cm walled-off high attenuating fluid collection extending from the right inguinal and right iliac area into the pelvis above the bladder most compatible with a hematoma, possibly  subacute. No definite extravasation of contrast noted within this collection to suggest active bleed. Superimposed infection is not excluded. Celiac: The origin of the celiac axis appears patent. There is an accessory left hepatic artery arising from the left gastric artery. SMA: A SMA stent is noted and appears patent. Evaluation of the stent however is limited due to respiratory motion artifact. No definite extravasation of contrast or teres stent infiltration identified. The distal SMA appears patent. Renals: Both renal arteries are patent without evidence of aneurysm, dissection, vasculitis, fibromuscular dysplasia or significant stenosis. IMA: Appears unremarkable as visualized. Veins: The IVC is grossly unremarkable. The SMV, splenic vein, and main portal vein are patent. No portal venous gas identified. Review of the MIP images confirms the above findings. NON-VASCULAR Lower chest: The visualized lung bases are clear. No intra-abdominal free air or free fluid Hepatobiliary: The liver appears unremarkable. No intrahepatic biliary ductal dilatation. Vicarious excretion of contrast into the gallbladder noted. Pancreas: Unremarkable. No pancreatic ductal dilatation or surrounding inflammatory changes. Spleen: Normal in size without focal abnormality. Adrenals/Urinary Tract: Ill-defined 1.4 cm nodularity of the left adrenal gland, indeterminate. The right adrenal gland appears unremarkable. The kidneys, visualized ureters appear unremarkable. There is diffuse thickened appearance of the bladder wall which may be secondary to inflammatory changes and hematoma within the pelvis. Correlation with urinalysis recommended to evaluate for cystitis. Stomach/Bowel: Multiple nondistended fluid filled loops of small bowel noted throughout the abdomen. Loose stool noted within the colon compatible with diarrheal state. There is no evidence of bowel obstruction. Lymphatic: No adenopathy. Reproductive: The uterus is grossly  unremarkable. Other: Right inguinal subcutaneous stranding/scarring, likely related to femoral access. There is a small supraumbilical fat containing hernia. Musculoskeletal: No acute or significant osseous findings. IMPRESSION: VASCULAR No evidence of aortic aneurysm or dissection. SMA stent appears unremarkable and patent. Double-lumen appearance of the right external iliac artery concerning for dissection, possibly iatrogenic. There is an 8.2 x 8.2 cm loculated hematoma extending from the right inguinal and right iliac vessels into the pelvis above the urinary bladder, likely subacute. No definite evidence of active bleed. Evaluation of the vasculature is limited due to motion artifact. NON-VASCULAR No acute intra-abdominal pelvic pathology. **An incidental finding of potential clinical significance has been found. A 1.4 cm probable left adrenal nodule, indeterminate. Nonemergent MRI without and with contrast is recommended for further evaluation.** Thickened appearance of the bladder wall, possibly reactive to the hematoma within the pelvis. Correlation with urinalysis recommended to exclude cystitis. These results were called by telephone at the time of interpretation on 04/23/2016 at 6:35 am to Dr. Everlene Balls , who verbally acknowledged these results. Electronically Signed   By: Anner Crete M.D.   On: 04/23/2016 06:36        Filed Weights   04/23/16 0915 04/28/16 0751  Weight: 68.3 kg (150 lb 9.2 oz) 68.3 kg (150 lb 9.2 oz)     Microbiology: Recent Results (from the past 240 hour(s))  Urine culture     Status: Abnormal   Collection Time: 04/23/16  4:29 AM  Result Value Ref Range Status   Specimen Description URINE, CLEAN CATCH  Final   Special Requests NONE  Final   Culture >=100,000 COLONIES/mL PROTEUS MIRABILIS (A)  Final   Report Status  04/25/2016 FINAL  Final   Organism ID, Bacteria PROTEUS MIRABILIS (A)  Final      Susceptibility   Proteus mirabilis - MIC*    AMPICILLIN <=2  SENSITIVE Sensitive     CEFAZOLIN <=4 SENSITIVE Sensitive     CEFTRIAXONE <=1 SENSITIVE Sensitive     CIPROFLOXACIN <=0.25 SENSITIVE Sensitive     GENTAMICIN <=1 SENSITIVE Sensitive     IMIPENEM 2 SENSITIVE Sensitive     NITROFURANTOIN 128 RESISTANT Resistant     TRIMETH/SULFA <=20 SENSITIVE Sensitive     AMPICILLIN/SULBACTAM <=2 SENSITIVE Sensitive     PIP/TAZO <=4 SENSITIVE Sensitive     * >=100,000 COLONIES/mL PROTEUS MIRABILIS  MRSA PCR Screening     Status: None   Collection Time: 04/23/16 10:39 AM  Result Value Ref Range Status   MRSA by PCR NEGATIVE NEGATIVE Final    Comment:        The GeneXpert MRSA Assay (FDA approved for NASAL specimens only), is one component of a comprehensive MRSA colonization surveillance program. It is not intended to diagnose MRSA infection nor to guide or monitor treatment for MRSA infections.        Blood Culture    Component Value Date/Time   SDES URINE, CLEAN CATCH 04/23/2016 0429   SPECREQUEST NONE 04/23/2016 0429   CULT >=100,000 COLONIES/mL PROTEUS MIRABILIS (A) 04/23/2016 0429   REPTSTATUS 04/25/2016 FINAL 04/23/2016 0429      Labs: Results for orders placed or performed during the hospital encounter of 04/23/16 (from the past 48 hour(s))  CBC     Status: Abnormal   Collection Time: 04/27/16  3:30 AM  Result Value Ref Range   WBC 9.2 4.0 - 10.5 K/uL   RBC 2.54 (L) 3.87 - 5.11 MIL/uL   Hemoglobin 8.1 (L) 12.0 - 15.0 g/dL   HCT 24.1 (L) 36.0 - 46.0 %   MCV 94.9 78.0 - 100.0 fL   MCH 31.9 26.0 - 34.0 pg   MCHC 33.6 30.0 - 36.0 g/dL   RDW 18.3 (H) 11.5 - 15.5 %   Platelets 308 150 - 400 K/uL  Comprehensive metabolic panel     Status: Abnormal   Collection Time: 04/27/16  3:30 AM  Result Value Ref Range   Sodium 136 135 - 145 mmol/L   Potassium 3.2 (L) 3.5 - 5.1 mmol/L   Chloride 104 101 - 111 mmol/L   CO2 25 22 - 32 mmol/L   Glucose, Bld 108 (H) 65 - 99 mg/dL   BUN <5 (L) 6 - 20 mg/dL   Creatinine, Ser 0.59 0.44 -  1.00 mg/dL   Calcium 8.9 8.9 - 10.3 mg/dL   Total Protein 5.8 (L) 6.5 - 8.1 g/dL   Albumin 3.1 (L) 3.5 - 5.0 g/dL   AST 15 15 - 41 U/L   ALT 10 (L) 14 - 54 U/L   Alkaline Phosphatase 59 38 - 126 U/L   Total Bilirubin 0.4 0.3 - 1.2 mg/dL   GFR calc non Af Amer >60 >60 mL/min   GFR calc Af Amer >60 >60 mL/min    Comment: (NOTE) The eGFR has been calculated using the CKD EPI equation. This calculation has not been validated in all clinical situations. eGFR's persistently <60 mL/min signify possible Chronic Kidney Disease.    Anion gap 7 5 - 15  H. pylori antibody, IgG     Status: None   Collection Time: 04/27/16  3:30 AM  Result Value Ref Range   H Pylori IgG <0.9 0.0 -  0.8 U/mL    Comment: (NOTE)                             Negative            <0.9                             Indeterminate  0.9 - 1.0                             Positive            >1.0 Performed At: [facility] [place], [place] [id number] Lindon Romp MD [id number]   CBC     Status: Abnormal   Collection Time: 04/28/16  4:34 AM  Result Value Ref Range   WBC 9.4 4.0 - 10.5 K/uL   RBC 2.49 (L) 3.87 - 5.11 MIL/uL   Hemoglobin 7.9 (L) 12.0 - 15.0 g/dL   HCT 24.0 (L) 36.0 - 46.0 %   MCV 96.4 78.0 - 100.0 fL   MCH 31.7 26.0 - 34.0 pg   MCHC 32.9 30.0 - 36.0 g/dL   RDW 18.2 (H) 11.5 - 15.5 %   Platelets 308 150 - 400 K/uL     Lipid Panel  No results found for: CHOL, TRIG, HDL, CHOLHDL, VLDL, LDLCALC, LDLDIRECT   No results found for: HGBA1C   Lab Results  Component Value Date   CREATININE 0.59 04/27/2016     45 y.o.[place] who presented to ED around midnight with weakness and vomiting. Patient was in good health until late September when she was hospitalized three times. Patient presented to [facility] in [place] in September with abdominal pain, she underwent SMA stenting for stenosis. Started on plavix post-procedure. 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 Following that she developed a right groin hematoma (surgically evacuated ) and a retroperitoneal hematoma. Following all this patient was admitted a third time with constipation / impaction. After bowel purge patient felt much better. She did require blood transfusion during two of the recent admissions. Patient had not passed any blood, stools were dark but not black. There was no mention of GI bleeding during any of those admission.  Yesterday she became extremely weak with nausea and loose nonbloody stool. She reports 2 syncopal episodes at home. Patient went to [facility] ED earlier this am. Hgb 6-7. CTA showed hematoma extending from right inguinal and right iliac vessels into the pelvis, likely subacute, patient transferred to [facility] in case surgical intervention was needed. Upon arriving to [facility] patient had a large dark bloody BM  Assessment and plan painless hematochezia while on plavix and ASA. Hgb down a couple of grams since Oct 8th. 8.2 >>>>6.8 on admission after receiving 2 units of blood  , now 9.1>7.8 >8.1 >7.9    No further episodes of bleeding since 10/20  Status post hematochezia 10/20 Plavix on hold, GI recommends to keep this on hold for a total of 10 days EGD showed nonbleeding duodenal ulcer, H. pylori IgG serology negative Colonoscopy -nonbleeding internal hemorrhoids-GI recommends repeat colonoscopy in one year tagged RBC studies negative Monitor CBC closely, if okay with GI may be able to  start   Plavix in 10 days per GI Discussed with vascular surgery in [place] ,Dr Tommie Ard, he  said its ok to leave patient off aspirin in the setting of her ulcer and continue plavix alone  for a total of 6 months Continue PPI and Carafate  Anemia of acute blood loss. Receivedseveral units of blood in [place] in Sept following vascular procedures complicated by right groin and retroperitoneal hematoma. Hgb low 8  range upon hospital discharge in [place] around Oct 8th. 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 Following this she developed a right groin hematoma (requring surgical evacuation) and a right retroperitoneal bleed). 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 Vascular Surgery recommends conservative measures for now, to continue antiplatelet therapy, which is limited by her GI bleeding issues.  Vascular surgery  consulted and recommended to continue with antiplatelet therapy If she required IR embolization, it would be beneficial to evaluate her right iliac and femoral arteries She also has RP  hematoma that appears to be stable Resume plavix as above .   Trichomonas UTI, leukocytosis/Proteus mirabilis UTI -Status post treatment with Flagyl 2 gm once given in ER   chlamydia/gonorrhea still pending, HIV antibody negative Continue Rocephin IV for UTI,omnicef at DC   Wound right groin  Right inguinal nonhealing surgical wound.    Wound type:surgical Measurement:0.5 cm in diameter, depth 0.5 cm  Wound FGH:WEXH pink, nongranulating Drainage (amount, consistency, odor) Minimal serosanguinous.  No odor Periwound:Intact Dressing procedure/placement/frequency:Cleanse wound to right inguinal fold with NS.  NS moist packing strip to wound twice daily.  Cover with 4x4 gauze/tape.     H/o HTN - cont Amlodipine and Toprol (12.5 mg daily)  Complaint of always being tachycardic-related to her GI bleeding/anemia TSH 0.7   Mid right back pain  Due to 8.2 x8.2 cm loculated hematoma extending from the right inguinal and right iliac vessels into the pelvis above the urinary bladder,likely subacute patient  also has has scoliosis midthoracic spine MRI consistent with scoliosis, degenerative changes, without significant spinal stenosis, no.nerve root compression Continue Flexeril as needed for back pain     Discharge Exam: *  Blood pressure 110/80, pulse 92, temperature 99.6 F (37.6 C), temperature source Oral, resp. rate 16, height _0  (1.626 m), weight 68.3 kg (150 lb 9.2 oz), last menstrual period 04/23/2014, SpO2 100 %.  General exam: Appears calm and comfortable  Respiratory system: Clear to auscultation. Respiratory effort normal. Cardiovascular system: S1 & S2 heard, RRR. No JVD, murmurs, rubs, gallops or clicks. No pedal edema. Gastrointestinal system: Abdomen is nondistended, soft and nontender. No organomegaly or masses felt. Normal bowel sounds heard. Central nervous system: Alert and oriented. No focal neurological deficits. Extremities: Symmetric 5 x 5 power. Skin: No rashes, lesions or ulcers Psychiatry: Judgement and insight appear normal. Mood & affect appropriate.    Follow-up [facility] Follow up on 05/10/2016.   Why:  11:15 for hospital follow up  Contact information: [place] [address] [phone]       Manus Gunning, MD. Call in 2 day(s).   Specialty:  Gastroenterology Why:  Call to make this appointment as soon as possible, you would need to see GI in 3-4 weeks Contact information: [address] [place] [address] [phone]  SignedReyne Dumas 04/28/2016, 8:57 AM        Time spent >45 mins

## 2016-05-05 ENCOUNTER — Telehealth: Payer: Self-pay | Admitting: Gastroenterology

## 2016-05-05 NOTE — Telephone Encounter (Signed)
Pt states she is having some abdominal discomfort and wanted to know if she should start her Plavix back sooner than 05/10/16. Pt states she does not have a vascular surgeon here yet. Discussed with pt that she should contact her PCP regarding this question. Pt verbalized understanding.

## 2016-05-10 ENCOUNTER — Encounter: Payer: Self-pay | Admitting: Family Medicine

## 2016-05-10 ENCOUNTER — Ambulatory Visit: Payer: Medicaid Other | Attending: Family Medicine | Admitting: Family Medicine

## 2016-05-10 VITALS — BP 150/90 | HR 81 | Temp 98.5°F | Ht 64.0 in | Wt 156.4 lb

## 2016-05-10 DIAGNOSIS — I7772 Dissection of iliac artery: Secondary | ICD-10-CM | POA: Diagnosis not present

## 2016-05-10 DIAGNOSIS — I10 Essential (primary) hypertension: Secondary | ICD-10-CM | POA: Insufficient documentation

## 2016-05-10 DIAGNOSIS — Z7902 Long term (current) use of antithrombotics/antiplatelets: Secondary | ICD-10-CM | POA: Diagnosis not present

## 2016-05-10 DIAGNOSIS — D649 Anemia, unspecified: Secondary | ICD-10-CM | POA: Diagnosis present

## 2016-05-10 DIAGNOSIS — E876 Hypokalemia: Secondary | ICD-10-CM | POA: Diagnosis not present

## 2016-05-10 DIAGNOSIS — D500 Iron deficiency anemia secondary to blood loss (chronic): Secondary | ICD-10-CM | POA: Insufficient documentation

## 2016-05-10 DIAGNOSIS — K264 Chronic or unspecified duodenal ulcer with hemorrhage: Secondary | ICD-10-CM | POA: Diagnosis not present

## 2016-05-10 LAB — COMPLETE METABOLIC PANEL WITH GFR
ALT: 20 U/L (ref 6–29)
AST: 16 U/L (ref 10–30)
Albumin: 4.2 g/dL (ref 3.6–5.1)
Alkaline Phosphatase: 78 U/L (ref 33–115)
BILIRUBIN TOTAL: 0.3 mg/dL (ref 0.2–1.2)
BUN: 15 mg/dL (ref 7–25)
CHLORIDE: 103 mmol/L (ref 98–110)
CO2: 25 mmol/L (ref 20–31)
Calcium: 9.8 mg/dL (ref 8.6–10.2)
Creat: 0.57 mg/dL (ref 0.50–1.10)
Glucose, Bld: 85 mg/dL (ref 65–99)
POTASSIUM: 4 mmol/L (ref 3.5–5.3)
Sodium: 139 mmol/L (ref 135–146)
TOTAL PROTEIN: 7.1 g/dL (ref 6.1–8.1)

## 2016-05-10 MED ORDER — METOPROLOL SUCCINATE ER 25 MG PO TB24: 12.5000 mg | ORAL_TABLET | Freq: Every day | ORAL | 3 refills | Status: DC

## 2016-05-10 MED ORDER — AMLODIPINE BESYLATE 10 MG PO TABS: 10.0000 mg | ORAL_TABLET | Freq: Every day | ORAL | 3 refills | Status: DC

## 2016-05-10 NOTE — Progress Notes (Signed)
Subjective:  Patient ID: Michelle Joyce, female    DOB: 04/24/1971  Age: 45 y.o. MRN: [id number]  CC: Anemia and Hypertension   HPI Michelle Joyce is a 45 year old female who comes into the clinic for follow-up from hospitalization at [facility] from 04/23/2016 through 04/28/2016.where she was managed for anemia secondary to duodenal ulcer with hemorrhage. 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  She had presented to [facility] with weakness, nausea and loose stool and a hemoglobin of 6.7; CTA showed hematoma extending from right inguinal and right iliac vessels into the pelvis, likely subacute, patient was transferred to [facility] in the event that surgical intervention would be indicated. She was also noticed to have large dark bloody bowel movement. She receives 2 units of PRBC, underwent EGD which revealed nonbleeding duodenal ulcer, H pylori negative. Colonoscopy revealed nonbleeding internal hemorrhoids, tagged RBC studies was negative.  Plavix was put on hold during hospitalization, commenced on PPI twice daily for 8 weeks and also placed on Carafate. she was also treated for Proteus mirabilis UTI. Her condition improved and she was subsequently discharged with recommendations to follow-up with GI and to resume Plavix after 10 days.  She has an upcoming appointment with Dr.Armbruster (GI); does not have a scheduled appointment with vascular surgery. She denies obstipation, diarrhea hematochezia, hematemesis, abdominal pain and seems to be doing well. She has no acute concerns today.  Past Medical History:  Diagnosis Date  . Hypertension     Past Surgical History:  Procedure Laterality Date  . COLONOSCOPY N/A 04/25/2016   Procedure:  COLONOSCOPY;  Surgeon: Manus Gunning, MD;  Location: [facility] ENDOSCOPY;  Service: Gastroenterology;  Laterality: N/A;  . ESOPHAGOGASTRODUODENOSCOPY N/A 04/25/2016   Procedure: ESOPHAGOGASTRODUODENOSCOPY (EGD);  Surgeon: Manus Gunning, MD;  Location: [facility];  Service: Gastroenterology;  Laterality: N/A;  . MESENTERIC ARTERY BYPASS      No Known Allergies   Outpatient Medications Prior to Visit  Medication Sig Dispense Refill  . clopidogrel (PLAVIX) 75 MG tablet Take 1 tablet (75 mg total) by mouth daily. 30 tablet 0  . ferrous sulfate 300 (60 Fe) MG/5ML syrup Take 5 mLs (300 mg total) by mouth daily. 150 mL 3  . pantoprazole (PROTONIX) 40 MG tablet Take 1 tablet (40 mg total) by mouth 2 (two) times daily. 60 tablet 2  . polyethylene glycol (MIRALAX / GLYCOLAX) packet Take 17 g by mouth daily.    . sucralfate (CARAFATE) 1 GM/10ML suspension Take 10 mLs (1 g total) by mouth 4 (four) times daily -  with meals and at bedtime. 420 mL 0  . amLODipine (NORVASC) 10 MG tablet Take 10 mg by mouth daily.    . metoprolol succinate (TOPROL-XL) 25 MG 24 hr tablet Take 0.5 tablets (12.5 mg total) by mouth daily. 30 tablet 1  . cyclobenzaprine (FLEXERIL) 10 MG tablet Take 1 tablet (10 mg total) by mouth 3 (three) times daily as needed for muscle spasms. (Patient not taking: Reported on 05/10/2016) 30 tablet 0  . HYDROcodone-acetaminophen (NORCO/VICODIN) 5-325 MG tablet Take 1 tablet by mouth every 4 (four) hours as needed for moderate pain. (Patient not taking: Reported on 05/10/2016) 30 tablet 0  . ondansetron (ZOFRAN) 4 MG tablet Take 1 tablet (4 mg total) by mouth every 6 (six) hours as needed for  nausea. (Patient not taking: Reported on 05/10/2016) 20 tablet 0  . ondansetron (ZOFRAN-ODT) 4 MG disintegrating tablet Take 4 mg by mouth every 6 (six) hours as needed for nausea or vomiting.     No facility-administered medications prior to visit.     ROS Review of Systems  Constitutional:  Negative for activity change, appetite change and fatigue.  HENT: Negative for congestion, sinus pressure and sore throat.   Eyes: Negative for visual disturbance.  Respiratory: Negative for cough, chest tightness, shortness of breath and wheezing.   Cardiovascular: Negative for chest pain and palpitations.  Gastrointestinal: Negative for abdominal distention, abdominal pain and constipation.  Endocrine: Negative for polydipsia.  Genitourinary: Negative for dysuria and frequency.  Musculoskeletal: Negative for arthralgias and back pain.  Skin: Negative for rash.  Neurological: Negative for tremors, light-headedness and numbness.  Hematological: Does not bruise/bleed easily.  Psychiatric/Behavioral: Negative for agitation and behavioral problems.    Objective:  BP (!) 150/90 (BP Location: Right Arm, Patient Position: Sitting, Cuff Size: Small)   Pulse 81   Temp 98.5 F (36.9 C) (Oral)   Ht 5\' 4"  (1.626 m)   Wt 156 lb 6.4 oz (70.9 kg)   LMP 04/23/2014 (Approximate) Comment: negative beta HCG 04/23/16  SpO2 100%   BMI 26.85 kg/m   BP/Weight 05/10/2016 04/28/2016 [id number]  Systolic BP [id number] -  Diastolic BP 90 81 -  Wt. (Lbs) 156.4 150.57 -  BMI 26.85 - 25.85      Physical Exam  Constitutional: She is oriented to person, place, and time. She appears well-developed and well-nourished.  Cardiovascular: Normal rate, normal heart sounds and intact distal pulses.   No murmur heard. Pulmonary/Chest: Effort normal and breath sounds normal. She has no wheezes. She has no rales. She exhibits no tenderness.  Abdominal: Soft. Bowel sounds are normal. She exhibits no distension and no mass. There is no tenderness.  Musculoskeletal: Normal range of motion.  Neurological: She is alert and oriented to person, place, and time.  Skin:  Right groin wound (<0.5cm in diameter) with no drainage which is healing     CBC Latest Ref Rng & Units 04/28/2016 04/27/2016 04/26/2016  WBC 4.0 - 10.5  K/uL 9.4 9.2 9.1  Hemoglobin 12.0 - 15.0 g/dL 7.9(L) 8.1(L) 7.8(L)  Hematocrit 36.0 - 46.0 % 24.0(L) 24.1(L) 23.9(L)  Platelets 150 - 400 K/uL 308 308 276    CMP Latest Ref Rng & Units 04/27/2016 04/25/2016 04/24/2016  Glucose 65 - 99 mg/dL 108(H) 106(H) 119(H)  BUN 6 - 20 mg/dL <5(L) <5(L) 9  Creatinine 0.44 - 1.00 mg/dL 0.59 0.55 0.58  Sodium 135 - 145 mmol/L 136 136 136  Potassium 3.5 - 5.1 mmol/L 3.2(L) 3.5 3.6  Chloride 101 - 111 mmol/L 104 104 105  CO2 22 - 32 mmol/L 25 24 24   Calcium 8.9 - 10.3 mg/dL 8.9 9.3 8.8(L)  Total Protein 6.5 - 8.1 g/dL 5.8(L) 6.5 -  Total Bilirubin 0.3 - 1.2 mg/dL 0.4 0.8 -  Alkaline Phos 38 - 126 U/L 59 63 -  AST 15 - 41 U/L 15 18 -  ALT 14 - 54 U/L 10(L) 13(L) -    Assessment & Plan:   1. Duodenal ulcer with hemorrhage No recent GI bleeds Continue Protonix twice daily for the next 6 weeks (for a total of 8 weeks)  2. 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 Anemia, blood loss Last hemoglobin was 7.9 at discharge Continue iron replacement - CBC with Differential/Platelet  4. Hypokalemia Potassium at discharge was 3.2 We'll check levels today and initiate potassium replacement is still hypokalemic - COMPLETE METABOLIC PANEL WITH GFR  5. Essential hypertension Uncontrolled due to not taking antihypertensive this morning. Patient advised to take medications in the morning. Low sodium diet - amLODipine (NORVASC) 10 MG tablet; Take 1 tablet (10 mg total) by mouth daily.  Dispense: 30 tablet; Refill: 3 - metoprolol succinate (TOPROL-XL) 25 MG 24 hr tablet; Take 0.5 tablets (12.5 mg total) by mouth daily.  Dispense: 30 tablet; Refill: 3   Meds ordered this encounter  Medications  . amLODipine (NORVASC) 10 MG tablet    Sig: Take 1 tablet (10 mg total)  by mouth daily.    Dispense:  30 tablet    Refill:  3  . metoprolol succinate (TOPROL-XL) 25 MG 24 hr tablet    Sig: Take 0.5 tablets (12.5 mg total) by mouth daily.    Dispense:  30 tablet    Refill:  3    Follow-up: Return in about 2 weeks (around 05/24/2016) for Follow-up: anemia.   Arnoldo Morale MD

## 2016-05-10 NOTE — Patient Instructions (Signed)
Anemia, Nonspecific Anemia is a condition in which the concentration of red blood cells or hemoglobin in the blood is below normal. Hemoglobin is a substance in red blood cells that carries oxygen to the tissues of the body. Anemia results in not enough oxygen reaching these tissues.  CAUSES  Common causes of anemia include:   Excessive bleeding. Bleeding may be internal or external. This includes excessive bleeding from periods (in women) or from the intestine.   Poor nutrition.   Chronic kidney, thyroid, and liver disease.  Bone marrow disorders that decrease red blood cell production.  Cancer and treatments for cancer.  HIV, AIDS, and their treatments.  Spleen problems that increase red blood cell destruction.  Blood disorders.  Excess destruction of red blood cells due to infection, medicines, and autoimmune disorders. SIGNS AND SYMPTOMS   Minor weakness.   Dizziness.   Headache.  Palpitations.   Shortness of breath, especially with exercise.   Paleness.  Cold sensitivity.  Indigestion.  Nausea.  Difficulty sleeping.  Difficulty concentrating. Symptoms may occur suddenly or they may develop slowly.  DIAGNOSIS  Additional blood tests are often needed. These help your health care provider determine the best treatment. Your health care provider will check your stool for blood and look for other causes of blood loss.  TREATMENT  Treatment varies depending on the cause of the anemia. Treatment can include:   Supplements of iron, vitamin B12, or folic acid.   Hormone medicines.   A blood transfusion. This may be needed if blood loss is severe.   Hospitalization. This may be needed if there is significant continual blood loss.   Dietary changes.  Spleen removal. HOME CARE INSTRUCTIONS Keep all follow-up appointments. It often takes many weeks to correct anemia, and having your health care provider check on your condition and your response to  treatment is very important. SEEK IMMEDIATE MEDICAL CARE IF:   You develop extreme weakness, shortness of breath, or chest pain.   You become dizzy or have trouble concentrating.  You develop heavy vaginal bleeding.   You develop a rash.   You have bloody or black, tarry stools.   You faint.   You vomit up blood.   You vomit repeatedly.   You have abdominal pain.  You have a fever or persistent symptoms for more than 2-3 days.   You have a fever and your symptoms suddenly get worse.   You are dehydrated.  MAKE SURE YOU:  Understand these instructions.  Will watch your condition.  Will get help right away if you are not doing well or get worse.   This information is not intended to replace advice given to you by your health care provider. Make sure you discuss any questions you have with your health care provider.   Document Released: 07/29/2004 Document Revised: 02/21/2013 Document Reviewed: 12/15/2012 Elsevier Interactive Patient Education 2016 Elsevier Inc.  

## 2016-05-11 LAB — CBC WITH DIFFERENTIAL/PLATELET
BASOS PCT: 0 %
Basophils Absolute: 0 cells/uL (ref 0–200)
EOS ABS: 154 {cells}/uL (ref 15–500)
Eosinophils Relative: 2 %
HCT: 36 % (ref 35.0–45.0)
Hemoglobin: 11.4 g/dL — ABNORMAL LOW (ref 11.7–15.5)
LYMPHS ABS: 1848 {cells}/uL (ref 850–3900)
Lymphocytes Relative: 24 %
MCH: 31.5 pg (ref 27.0–33.0)
MCHC: 31.7 g/dL — ABNORMAL LOW (ref 32.0–36.0)
MCV: 99.4 fL (ref 80.0–100.0)
MONOS PCT: 6 %
MPV: 8.9 fL (ref 7.5–12.5)
Monocytes Absolute: 462 cells/uL (ref 200–950)
NEUTROS ABS: 5236 {cells}/uL (ref 1500–7800)
Neutrophils Relative %: 68 %
Platelets: 479 10*3/uL — ABNORMAL HIGH (ref 140–400)
RBC: 3.62 MIL/uL — ABNORMAL LOW (ref 3.80–5.10)
RDW: 16 % — ABNORMAL HIGH (ref 11.0–15.0)
WBC: 7.7 10*3/uL (ref 3.8–10.8)

## 2016-05-12 ENCOUNTER — Telehealth: Payer: Self-pay

## 2016-05-12 NOTE — Telephone Encounter (Signed)
Spoke with patient regarding lab results.  Patient stated understanding.

## 2016-05-12 NOTE — Telephone Encounter (Signed)
-----   Message from Arnoldo Morale, MD sent at 05/11/2016  2:06 PM EST ----- Anemia has improved significantly, kidney and liver function are normal.

## 2016-05-13 ENCOUNTER — Telehealth: Payer: Self-pay | Admitting: General Practice

## 2016-05-13 NOTE — Telephone Encounter (Signed)
She might want to go back on the MiraLAX and take that as well as the Plavix and keep [place] updated about her symptoms. If pain worsens or she has blood in her stool she will have to go to the emergency room.

## 2016-05-13 NOTE — Telephone Encounter (Signed)
Writer called patient back regarding taking plavix.  Patient states that she started back on the plavix 4 days ago and stopped taking miralax after taking it for almost 30 days.  Patient is c/o stomach discomfort that starts midline and radiates to her right side- she describes it as a dull ache.  She states that the last time she was on plavix she experienced this same type of pain, however she also admits to having stool impaction at that time.  Patient had "a very large BM yesterday " and states that is when the pain in her abdomen started. At this time she is wondering if the pain she is experiencing is from the plavix or going off the miralax. She did have upper back pain but stated that she took the flexeril which relieved the pain temporarily.

## 2016-05-13 NOTE — Telephone Encounter (Signed)
Writer spoke with patient again and gave her MD's advice.  She is requesting for a pain medication for her back because the flexiril did not work for her at all this afternoon.

## 2016-05-13 NOTE — Telephone Encounter (Signed)
Pt requesting to speak to nurse regarding Plavix Rx Pt states she has not been feeling well since starting medication

## 2016-05-14 ENCOUNTER — Telehealth: Payer: Self-pay

## 2016-05-14 NOTE — Telephone Encounter (Signed)
Patient has no more vicodin.

## 2016-05-14 NOTE — Telephone Encounter (Signed)
She can resume the Vicodin which she was prescribed at discharge as she had informed me she did not feel the need to take it then but will need to take a laxative to prevent constipation.

## 2016-05-17 ENCOUNTER — Telehealth: Payer: Self-pay

## 2016-05-17 ENCOUNTER — Other Ambulatory Visit: Payer: Self-pay | Admitting: Family Medicine

## 2016-05-17 MED ORDER — TRAMADOL HCL 50 MG PO TABS: 50.0000 mg | ORAL_TABLET | Freq: Three times a day (TID) | ORAL | 0 refills | Status: DC | PRN

## 2016-05-17 MED ORDER — TRAMADOL HCL 50 MG PO TABS: 50.0000 mg | ORAL_TABLET | Freq: Three times a day (TID) | ORAL | 0 refills | Status: AC | PRN

## 2016-05-17 NOTE — Telephone Encounter (Signed)
Writer called patient and spoke with her Aunt who will let her know that her medication is ready for her at the front desk.

## 2016-05-17 NOTE — Addendum Note (Signed)
Addended by: Arnoldo Morale on: 05/17/2016 11:54 AM   Modules accepted: Orders

## 2016-05-17 NOTE — Telephone Encounter (Signed)
Tramadol prescription is ready for pickup.

## 2016-05-18 ENCOUNTER — Telehealth: Payer: Self-pay | Admitting: General Practice

## 2016-05-18 NOTE — Telephone Encounter (Signed)
Not able to leave message to inform patient that her rx is ready to be picked up and she needs to do so before Friday 11/17.

## 2016-05-21 ENCOUNTER — Ambulatory Visit: Payer: Medicaid Other | Admitting: Family Medicine

## 2016-05-31 ENCOUNTER — Encounter (INDEPENDENT_AMBULATORY_CARE_PROVIDER_SITE_OTHER): Payer: Self-pay

## 2016-05-31 ENCOUNTER — Ambulatory Visit (INDEPENDENT_AMBULATORY_CARE_PROVIDER_SITE_OTHER): Payer: Medicaid Other | Admitting: Physician Assistant

## 2016-05-31 ENCOUNTER — Encounter: Payer: Self-pay | Admitting: Physician Assistant

## 2016-05-31 VITALS — BP 142/98 | HR 120 | Ht 64.0 in | Wt 157.2 lb

## 2016-05-31 DIAGNOSIS — Z8719 Personal history of other diseases of the digestive system: Secondary | ICD-10-CM | POA: Diagnosis not present

## 2016-05-31 DIAGNOSIS — K229 Disease of esophagus, unspecified: Secondary | ICD-10-CM | POA: Diagnosis not present

## 2016-05-31 DIAGNOSIS — Z7901 Long term (current) use of anticoagulants: Secondary | ICD-10-CM | POA: Diagnosis not present

## 2016-05-31 DIAGNOSIS — K5909 Other constipation: Secondary | ICD-10-CM

## 2016-05-31 DIAGNOSIS — R1013 Epigastric pain: Secondary | ICD-10-CM

## 2016-05-31 MED ORDER — SUCRALFATE 1 GM/10ML PO SUSP: 1.0000 g | Freq: Three times a day (TID) | ORAL | 1 refills | Status: DC

## 2016-05-31 MED ORDER — PANTOPRAZOLE SODIUM 40 MG PO TBEC: 40.0000 mg | DELAYED_RELEASE_TABLET | Freq: Two times a day (BID) | ORAL | 2 refills | Status: DC

## 2016-05-31 MED FILL — CARAFATE 1 GM/10 ML SUSP: 1 | 11 days supply | Qty: 420 | Fill #0

## 2016-05-31 NOTE — Patient Instructions (Signed)
  We have sent the following medications to your pharmacy for you to pick up at your convenience: Pantoprazole, sucralfate   Restart Miralax.   We made you a follow up appointment with Dr Havery Moros for 07/26/2016 at 9:30AM.     I appreciate the opportunity to care for you.

## 2016-05-31 NOTE — Progress Notes (Signed)
Chief Complaint: Follow up on duodenal ulcer and abdominal pain  HPI:  Michelle Joyce is a 45 year old African-American female with a past medical history of dissection of the right iliac artery, duodenal ulcer, hypertension and lactic acidosis who presents to clinic today for follow-up after recently being seen in the hospital for abdominal pain and GI Bleed with finding of duodenal ulcer.   Per chart review patient had EGD and colonoscopy performed on 04/25/16 by Dr. Havery Moros. 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 Colonoscopy on the same day revealed fair preparation of the colon, nonbleeding internal hemorrhoids and otherwise normal exam. Repeat was recommended in 1 year given the fair bowel prep. At time of discharge, it was recommended by patient's vascular surgeon that she be on Plavix for a total of 6 months, alone without aspirin, due to her recent bleeding. Again it was discussed that she would need repeat EUS for further evaluation of esophageal subepithelial nodule. Likely this would need to occur when patient was able to come off of her Plavix.   Today, the patient tells me that for a month after time of discharge she felt well, but then 2-3 weeks ago she started having an increasing amount of abdominal pain again. She describes that this pain will occur intermittently throughout the day and typically starts in her upper back and will wrap around to her stomach, this is only better after she takes two muscle relaxers and sleeps this away. The patient tells me that she believes this pain restarted when she stopped her Carafate  as told by her PCP and took all of her medications together in the morning and also stopped her MiraLAX per her PCPs recommendations. Once this pain returned she called her PCP, about 2 weeks ago or so, and was told to restart her MiraLAX. Instead the patient went back to the timing of her medication she was doing previously including taking her Pantoprazole in the morning with her Plavix a couple of hours before eating and taking her other medicines. The patient tells me she does not want to become "addicted" to [place]. Patient tells me currently she is having a stool every couple of days which is "very large". Patient does tell me she has laid in bed for the past few days due to this pain. She tells me she is to be on her Plavix for a total of 6 months. She started this in September. She does continue her twice a day Pantoprazole.   Patient denies fever, chills, blood in her stool, melena, weight loss, anorexia, nausea, vomiting, heartburn, reflux or increase in gas or bloating.   Past Medical History:  Diagnosis Date  . Blood loss anemia   . Dissection of right iliac artery (HCC)   . Duodenal ulcer   . Gastrointestinal hemorrhage   . Hypertension   . Lactic acidosis     Past Surgical History:  Procedure Laterality Date  . COLONOSCOPY N/A 04/25/2016   Procedure: COLONOSCOPY;  Surgeon: Manus Gunning, MD;  Location: [facility] ENDOSCOPY;  Service: Gastroenterology;  Laterality: N/A;  . ESOPHAGOGASTRODUODENOSCOPY N/A 04/25/2016   Procedure: ESOPHAGOGASTRODUODENOSCOPY (EGD);  Surgeon: Remo Lipps  Gaye Alken, MD;  Location: [place];  Service: Gastroenterology;  Laterality: N/A;  . LITHOTRIPSY    . MESENTERIC ARTERY BYPASS      Current Outpatient Prescriptions  Medication Sig Dispense Refill  . amLODipine (NORVASC) 10 MG tablet Take 1 tablet (10 mg total) by mouth daily. 30 tablet 3  . clopidogrel (PLAVIX) 75 MG tablet Take 1 tablet (75 mg total) by mouth daily. 30 tablet 0  .  cyclobenzaprine (FLEXERIL) 10 MG tablet Take 1 tablet (10 mg total) by mouth 3 (three) times daily as needed for muscle spasms. 30 tablet 0  . ferrous sulfate 300 (60 Fe) MG/5ML syrup Take 5 mLs (300 mg total) by mouth daily. 150 mL 3  . metoprolol succinate (TOPROL-XL) 25 MG 24 hr tablet Take 0.5 tablets (12.5 mg total) by mouth daily. 30 tablet 3  . pantoprazole (PROTONIX) 40 MG tablet Take 1 tablet (40 mg total) by mouth 2 (two) times daily. 60 tablet 2  . sucralfate (CARAFATE) 1 GM/10ML suspension Take 10 mLs (1 g total) by mouth 4 (four) times daily -  with meals and at bedtime. 420 mL 0  . traMADol (ULTRAM) 50 MG tablet Take 1 tablet (50 mg total) by mouth every 8 (eight) hours as needed. 30 tablet 0   No current facility-administered medications for this visit.     Allergies as of 05/31/2016  . (No Known Allergies)    Family History  Problem Relation Age of Onset  . Colon cancer Neg Hx   . Esophageal cancer Neg Hx   . Pancreatic cancer Neg Hx   . Stomach cancer Neg Hx   . Liver disease Neg Hx     Social History   Social History  . Marital status: Single    Spouse name: N/A  . Number of children: N/A  . Years of education: N/A   Occupational History  . Not on file.   Social History Main Topics  . Smoking status: Former Smoker    Packs/day: 0.50    Years: 20.00    Types: Cigarettes  . Smokeless tobacco: Never Used  . Alcohol use 0.6 - 1.2 oz/week    1 - 2 Glasses of wine per week     Comment: socially  . Drug use:     Types: Marijuana     Comment: last time august 2017  . Sexual activity: Not on file   Other Topics Concern  . Not on file   Social History Narrative  . No narrative on file    Review of Systems:     Constitutional: No weight loss, fever or chills Cardiovascular: No chest pain Respiratory: No SOB  Gastrointestinal: See HPI and otherwise negative   Physical Exam:  Vital signs: Ht 5\' 4"  (1.626 m)   Wt 157 lb 3.2 oz (71.3 kg)   LMP  04/23/2014 (Approximate) Comment: negative beta HCG 04/23/16  BMI 26.98 kg/m   Constitutional:   Pleasant African American female appears to be in NAD, Well developed, Well nourished, alert and cooperative Respiratory: Respirations even and unlabored. Lungs clear to auscultation bilaterally.   No wheezes, crackles, or rhonchi.  Cardiovascular: Normal S1, S2. No MRG. Regular rate and rhythm. No peripheral edema, cyanosis or pallor.  Gastrointestinal:  Soft, nondistended, moderate tenderness in the epigastrium and in the right lower quadrant No rebound or guarding. Normal bowel sounds. No appreciable masses or hepatomegaly. Rectal:  Not performed.  Msk:  Symmetrical without gross deformities. Without edema, no deformity or  joint abnormality.  Psychiatric:Demonstrates good judgement and reason without abnormal affect or behaviors.  RELEVANT LABS AND IMAGING: CBC    Component Value Date/Time   WBC 7.7 05/10/2016 1204   RBC 3.62 (L) 05/10/2016 1204   HGB 11.4 (L) 05/10/2016 1204   HCT 36.0 05/10/2016 1204   PLT 479 (H) 05/10/2016 1204   MCV 99.4 05/10/2016 1204   MCH 31.5 05/10/2016 1204   MCHC 31.7 (L) 05/10/2016 1204   RDW 16.0 (H) 05/10/2016 1204   LYMPHSABS 1,848 05/10/2016 1204   MONOABS 462 05/10/2016 1204   EOSABS 154 05/10/2016 1204   BASOSABS 0 05/10/2016 1204    CMP     Component Value Date/Time   NA 139 05/10/2016 1204   K 4.0 05/10/2016 1204   CL 103 05/10/2016 1204   CO2 25 05/10/2016 1204   GLUCOSE 85 05/10/2016 1204   BUN 15 05/10/2016 1204   CREATININE 0.57 05/10/2016 1204   CALCIUM 9.8 05/10/2016 1204   PROT 7.1 05/10/2016 1204   ALBUMIN 4.2 05/10/2016 1204   AST 16 05/10/2016 1204   ALT 20 05/10/2016 1204   ALKPHOS 78 05/10/2016 1204   BILITOT 0.3 05/10/2016 1204   GFRNONAA >89 05/10/2016 1204   GFRAA >89 05/10/2016 1204    Assessment: 1. History of duodenal ulcer colon: Seen at time of patient's recent hospital stay 04/25/16, patient placed on twice  a day Pantoprazole 40mg  for 8 weeks and Carafate, patient was feeling well when she left the hospital but now complains of abdominal pain, see below 2. Subepithelial esophageal nodule: Seen at time of recent EGD, EUS recommend in the future for further evaluation 3. Chronic anticoagulation: With Plavix alone after stenting of stenotic SMA on 03/2016 recommended the patient's remain on this for 6 months 4. Abdominal pain: Patient describes intermittent epigastric abdominal pain, which actually starts in her back and radiates around to her front, this is worse after she stopped Carafate and MiraLAX, likely constipation is contributing 5. Chronic constipation: Currently patient has a bowel movement "every couple of days", this is likely worsened by recent use of muscle relaxers  Plan: 1. Recommend the patient restart the regimen she was on after leaving the hospital as she felt well at that time. 2. Recommend she start MiraLAX daily, discussed that this medication is not one that she can become "addicted to", if it helps her to have regular bowel movements, then this is safe to stay on long-term 3. Recommend patient continue her Pantoprazole 40 mg twice a day, provided her with refills of this. 3. Restarted the patient's Carafate 4 times daily. This should be taken 1 hour before or 2 hours after eating and other medications. The patient verbalized understanding. 5. Will discuss case with Dr. Havery Moros, patient does need EUS as an outpatient, this will likely be when it is safe to take her off of her Plavix which would be in March of next year. If timing should change we will let the patient know. 6. Patient to follow in clinic with Dr. Havery Moros in 1-2 months or sooner if necessary.  Michelle Newer, PA-C [facility] 05/31/2016, 9:07 AM

## 2016-06-01 ENCOUNTER — Ambulatory Visit: Payer: Medicaid Other | Attending: Family Medicine | Admitting: Family Medicine

## 2016-06-01 VITALS — BP 122/80 | HR 119 | Temp 98.9°F | Ht 64.0 in | Wt 158.4 lb

## 2016-06-01 DIAGNOSIS — Z7902 Long term (current) use of antithrombotics/antiplatelets: Secondary | ICD-10-CM | POA: Insufficient documentation

## 2016-06-01 DIAGNOSIS — I10 Essential (primary) hypertension: Secondary | ICD-10-CM

## 2016-06-01 DIAGNOSIS — I7772 Dissection of iliac artery: Secondary | ICD-10-CM | POA: Diagnosis not present

## 2016-06-01 DIAGNOSIS — K264 Chronic or unspecified duodenal ulcer with hemorrhage: Secondary | ICD-10-CM

## 2016-06-01 DIAGNOSIS — D500 Iron deficiency anemia secondary to blood loss (chronic): Secondary | ICD-10-CM

## 2016-06-01 DIAGNOSIS — E872 Acidosis: Secondary | ICD-10-CM | POA: Diagnosis not present

## 2016-06-01 DIAGNOSIS — Z95828 Presence of other vascular implants and grafts: Secondary | ICD-10-CM | POA: Insufficient documentation

## 2016-06-01 NOTE — Patient Instructions (Signed)
Hypertension Hypertension, commonly called high blood pressure, is when the force of blood pumping through your arteries is too strong. Your arteries are the blood vessels that carry blood from your heart throughout your body. A blood pressure reading consists of a higher number over a lower number, such as 110/72. The higher number (systolic) is the pressure inside your arteries when your heart pumps. The lower number (diastolic) is the pressure inside your arteries when your heart relaxes. Ideally you want your blood pressure below 120/80. Hypertension forces your heart to work harder to pump blood. Your arteries may become narrow or stiff. Having untreated or uncontrolled hypertension can cause heart attack, stroke, kidney disease, and other problems. What increases the risk? Some risk factors for high blood pressure are controllable. Others are not. Risk factors you cannot control include:  Race. You may be at higher risk if you are African American.  Age. Risk increases with age.  Gender. Men are at higher risk than women before age 45 years. After age 65, women are at higher risk than men. Risk factors you can control include:  Not getting enough exercise or physical activity.  Being overweight.  Getting too much fat, sugar, calories, or salt in your diet.  Drinking too much alcohol. What are the signs or symptoms? Hypertension does not usually cause signs or symptoms. Extremely high blood pressure (hypertensive crisis) may cause headache, anxiety, shortness of breath, and nosebleed. How is this diagnosed? To check if you have hypertension, your health care provider will measure your blood pressure while you are seated, with your arm held at the level of your heart. It should be measured at least twice using the same arm. Certain conditions can cause a difference in blood pressure between your right and left arms. A blood pressure reading that is higher than normal on one occasion does  not mean that you need treatment. If it is not clear whether you have high blood pressure, you may be asked to return on a different day to have your blood pressure checked again. Or, you may be asked to monitor your blood pressure at home for 1 or more weeks. How is this treated? Treating high blood pressure includes making lifestyle changes and possibly taking medicine. Living a healthy lifestyle can help lower high blood pressure. You may need to change some of your habits. Lifestyle changes may include:  Following the DASH diet. This diet is high in fruits, vegetables, and whole grains. It is low in salt, red meat, and added sugars.  Keep your sodium intake below 2,300 mg per day.  Getting at least 30-45 minutes of aerobic exercise at least 4 times per week.  Losing weight if necessary.  Not smoking.  Limiting alcoholic beverages.  Learning ways to reduce stress. Your health care provider may prescribe medicine if lifestyle changes are not enough to get your blood pressure under control, and if one of the following is true:  You are 18-59 years of age and your systolic blood pressure is above 140.  You are 60 years of age or older, and your systolic blood pressure is above 150.  Your diastolic blood pressure is above 90.  You have diabetes, and your systolic blood pressure is over 140 or your diastolic blood pressure is over 90.  You have kidney disease and your blood pressure is above 140/90.  You have heart disease and your blood pressure is above 140/90. Your personal target blood pressure may vary depending on your medical   conditions, your age, and other factors. Follow these instructions at home:  Have your blood pressure rechecked as directed by your health care provider.  Take medicines only as directed by your health care provider. Follow the directions carefully. Blood pressure medicines must be taken as prescribed. The medicine does not work as well when you skip  doses. Skipping doses also puts you at risk for problems.  Do not smoke.  Monitor your blood pressure at home as directed by your health care provider. Contact a health care provider if:  You think you are having a reaction to medicines taken.  You have recurrent headaches or feel dizzy.  You have swelling in your ankles.  You have trouble with your vision. Get help right away if:  You develop a severe headache or confusion.  You have unusual weakness, numbness, or feel faint.  You have severe chest or abdominal pain.  You vomit repeatedly.  You have trouble breathing. This information is not intended to replace advice given to you by your health care provider. Make sure you discuss any questions you have with your health care provider. Document Released: 06/21/2005 Document Revised: 11/27/2015 Document Reviewed: 04/13/2013 Elsevier Interactive Patient Education  2017 Elsevier Inc.  

## 2016-06-01 NOTE — Progress Notes (Signed)
Subjective:    Patient ID: Michelle Joyce, female    DOB: 1970/12/10, 45 y.o.   MRN: [id number]  HPI Michelle Joyce is a 45 year old female with recent hospitalization for anemia secondary to duodenal ulcer with hemorrhage. 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  Her Plavix was resumed at her last office visit and she has tolerated this; denies hematemesis or hematochezia. She had a visit with GI yesterday and was resumed on Carafate due to persisting epigastric pain which is thought to be secondary to a duodenal ulcer.  She is yet to be seen by vascular but informs me that her Medicaid has been approved and she is waiting on receiving the Medicaid card.    Past Medical History:  Diagnosis Date  . Blood loss anemia   . Dissection of right iliac artery (HCC)   . Duodenal ulcer   . Gastrointestinal hemorrhage   . Hypertension   . Lactic acidosis     Past Surgical History:  Procedure Laterality Date  . COLONOSCOPY N/A 04/25/2016   Procedure: COLONOSCOPY;  Surgeon: Manus Gunning, MD;  Location: [facility] ENDOSCOPY;  Service: Gastroenterology;  Laterality: N/A;  . ESOPHAGOGASTRODUODENOSCOPY N/A 04/25/2016   Procedure: ESOPHAGOGASTRODUODENOSCOPY (EGD);  Surgeon: Manus Gunning, MD;  Location: [place];  Service: Gastroenterology;  Laterality: N/A;  . LITHOTRIPSY    . MESENTERIC ARTERY BYPASS      No Known Allergies   Review of Systems  Constitutional: Negative for activity change, appetite change and fatigue.  HENT: Negative for congestion, sinus pressure and sore throat.   Eyes: Negative for visual disturbance.  Respiratory: Negative for cough, chest tightness, shortness of breath and  wheezing.   Cardiovascular: Negative for chest pain and palpitations.  Gastrointestinal: Positive for abdominal pain. Negative for abdominal distention and constipation.  Endocrine: Negative for polydipsia.  Genitourinary: Negative for dysuria and frequency.  Musculoskeletal: Negative for arthralgias and back pain.  Skin: Negative for rash.  Neurological: Negative for tremors, light-headedness and numbness.  Hematological: Does not bruise/bleed easily.  Psychiatric/Behavioral: Negative for agitation and behavioral problems.       Objective: Vitals:   06/01/16 1442  BP: 122/80  Pulse: (!) 119  Temp: 98.9 F (37.2 C)  TempSrc: Oral  SpO2: 98%  Weight: 158 lb 6.4 oz (71.8 kg)  Height: 5\' 4"  (1.626 m)      Physical Exam  Constitutional: She is oriented to person, place, and time. She appears well-developed and well-nourished.  Cardiovascular: Normal heart sounds and intact distal pulses.  Tachycardia present.   No murmur heard. Pulmonary/Chest: Effort normal and breath sounds normal. She has no wheezes. She has no rales. She exhibits no tenderness.  Abdominal: Soft. Bowel sounds are normal. She exhibits no distension and no mass. There is tenderness (slight epigastic tenderness).  Musculoskeletal: Normal range of motion.  Neurological: She is alert and oriented to person, place, and time.          CBC Latest Ref Rng & Units 05/10/2016 04/28/2016 04/27/2016  WBC 3.8 - 10.8 K/uL 7.7 9.4 9.2  Hemoglobin 11.7 - 15.5 g/dL 11.4(L) 7.9(L) 8.1(L)  Hematocrit 35.0 - 45.0 % 36.0 24.0(L) 24.1(L)  Platelets 140 - 400 K/uL 479(H) 308 308    Assessment & Plan:  1. Duodenal ulcer with hemorrhage No  recent GI bleeds Continue Protonix twice daily for the next 3weeks (for a total of 8 weeks) Continue sucralfate which was ordered by GI  2. Dissection of right iliac artery (HCC) Status post SMA stent Continue Plavix  Referred to vascular surgery  3. Anemia, blood loss Stable, last  hemoglobin was 11.4 Continue iron replacement  4. Essential hypertension Controlled Low-sodium diet

## 2016-06-01 NOTE — Progress Notes (Signed)
Pt has taken BP medications today.

## 2016-06-02 ENCOUNTER — Encounter: Payer: Self-pay | Admitting: Family Medicine

## 2016-06-02 ENCOUNTER — Telehealth: Payer: Self-pay

## 2016-06-02 NOTE — Progress Notes (Signed)
Agree with assessment and plan as outlined: Agree with resumption of her medications given her symptoms She should have a follow up EUS for esophageal findings noted on EGD. If she cannot do this due to plavix issue until March I think that is okay, this is a small, likely benign lesion, not seen on CT scan previously. She needs to follow up with [place] to ensure this is done. Incidentally noted on review of prior CT is an adrenal nodule that radiology had recommended a follow up MRI. Can you please help coordinate MRI of her abdomen for this. Thanks

## 2016-06-02 NOTE — Telephone Encounter (Signed)
Left message on machine to call back  

## 2016-06-02 NOTE — Telephone Encounter (Signed)
-----   Message from [place], [place] sent at 06/02/2016  1:22 PM EST ----- Regarding: Patient needs Adbominal MRI Per Dr. Havery Moros, pt had incidental adrenal nodule on recent imaging and they recommended follow up MRI of abdomen. Can you please order. Thanks-JLL  ----- Message ----- From: Manus Gunning, MD Sent: 06/02/2016   1:13 PM To: Levin Erp, PA    ----- Message ----- From: Levin Erp, PA Sent: 05/31/2016  10:11 AM To: Manus Gunning, MD

## 2016-06-03 MED FILL — traMADol HCL 50 MG TABS: 50 | 10 days supply | Qty: 30 | Fill #0

## 2016-06-03 MED FILL — AMLODIPINE BESYLATE 10 MG T: 10 | 30 days supply | Qty: 30 | Fill #0

## 2016-06-03 NOTE — Telephone Encounter (Signed)
Left message on machine to call back  

## 2016-06-11 NOTE — Telephone Encounter (Signed)
Letter has been mailed to have the pt call and discuss MRI.  Unable to reach pt by phone.

## 2016-06-16 ENCOUNTER — Telehealth: Payer: Self-pay | Admitting: Physician Assistant

## 2016-06-16 DIAGNOSIS — E279 Disorder of adrenal gland, unspecified: Principal | ICD-10-CM

## 2016-06-16 DIAGNOSIS — E278 Other specified disorders of adrenal gland: Secondary | ICD-10-CM

## 2016-06-17 NOTE — Telephone Encounter (Signed)
  Pt has been given the information and instructions for MRI.  She will call with any concerns. Pt verbalized understanding of the instructions.    You have been scheduled for an MRI at [facility] on 06/25/16. Your appointment time is 10 am. Please arrive 30 minutes prior to your appointment time for registration purposes. Please make certain not to have anything to eat or drink 6 hours prior to your test. In addition, if you have any metal in your body, have a pacemaker or defibrillator, please be sure to let your ordering physician know. This test typically takes 45 minutes to 1 hour to complete.

## 2016-06-25 ENCOUNTER — Ambulatory Visit (HOSPITAL_COMMUNITY)
Admission: RE | Admit: 2016-06-25 | Discharge: 2016-06-25 | Disposition: A | Discharge status: Home / Self Care | Payer: Medicaid Other | Source: Ambulatory Visit | Attending: Gastroenterology | Admitting: Gastroenterology

## 2016-06-25 DIAGNOSIS — E279 Disorder of adrenal gland, unspecified: Secondary | ICD-10-CM | POA: Insufficient documentation

## 2016-06-25 DIAGNOSIS — E278 Other specified disorders of adrenal gland: Secondary | ICD-10-CM

## 2016-06-25 MED ORDER — GADOBENATE DIMEGLUMINE 529 MG/ML IV SOLN
15.0000 mL | Freq: Once | INTRAVENOUS | Status: AC | PRN
  Administered 2016-06-25: 14 mL via INTRAVENOUS

## 2016-06-30 ENCOUNTER — Telehealth: Payer: Self-pay | Admitting: Family Medicine

## 2016-06-30 NOTE — Telephone Encounter (Signed)
Writer called patient back to gather more information for MD  Basically she would like to know where she is to go from here .  What kind of f/u would Dr. Jarold Song like her to have and does further testing need to be performed?

## 2016-06-30 NOTE — Telephone Encounter (Signed)
Patient wants to discuss with pcp appointment from last week with GI doctor....please follow up

## 2016-07-01 NOTE — Telephone Encounter (Signed)
Schedulers to call and set up an appt with MD per Dr. Jarold Song.

## 2016-07-01 NOTE — Telephone Encounter (Signed)
An appointment to follow up on abdominal pain.Thanks.

## 2016-07-07 ENCOUNTER — Ambulatory Visit: Payer: Medicaid Other | Admitting: Family Medicine

## 2016-07-14 ENCOUNTER — Ambulatory Visit: Payer: Medicaid Other | Admitting: Family Medicine

## 2016-07-26 ENCOUNTER — Ambulatory Visit: Payer: Medicaid Other | Admitting: Gastroenterology

## 2016-07-28 ENCOUNTER — Encounter: Payer: Medicaid Other | Admitting: Surgery

## 2016-07-28 ENCOUNTER — Encounter: Payer: Medicaid Other | Admitting: Vascular Surgery

## 2016-07-30 ENCOUNTER — Encounter: Payer: Medicaid Other | Admitting: Vascular Surgery

## 2016-08-06 ENCOUNTER — Encounter: Payer: Self-pay | Admitting: Surgery

## 2016-08-06 ENCOUNTER — Ambulatory Visit: Payer: Medicaid Other | Attending: Family Medicine | Admitting: Family Medicine

## 2016-08-06 ENCOUNTER — Encounter: Payer: Self-pay | Admitting: Family Medicine

## 2016-08-06 VITALS — BP 184/120 | HR 85 | Temp 98.4°F | Ht 66.0 in | Wt 182.2 lb

## 2016-08-06 DIAGNOSIS — K264 Chronic or unspecified duodenal ulcer with hemorrhage: Secondary | ICD-10-CM | POA: Diagnosis not present

## 2016-08-06 DIAGNOSIS — E872 Acidosis: Secondary | ICD-10-CM | POA: Insufficient documentation

## 2016-08-06 DIAGNOSIS — I10 Essential (primary) hypertension: Secondary | ICD-10-CM | POA: Diagnosis present

## 2016-08-06 DIAGNOSIS — Z7902 Long term (current) use of antithrombotics/antiplatelets: Secondary | ICD-10-CM | POA: Insufficient documentation

## 2016-08-06 DIAGNOSIS — N912 Amenorrhea, unspecified: Secondary | ICD-10-CM | POA: Insufficient documentation

## 2016-08-06 DIAGNOSIS — D3502 Benign neoplasm of left adrenal gland: Secondary | ICD-10-CM | POA: Insufficient documentation

## 2016-08-06 DIAGNOSIS — I7772 Dissection of iliac artery: Secondary | ICD-10-CM | POA: Diagnosis not present

## 2016-08-06 DIAGNOSIS — R635 Abnormal weight gain: Secondary | ICD-10-CM | POA: Insufficient documentation

## 2016-08-06 LAB — TSH: TSH: 1.16 mIU/L

## 2016-08-06 MED ORDER — CLONIDINE HCL 0.1 MG PO TABS
0.2000 mg | ORAL_TABLET | Freq: Once | ORAL | Status: AC
  Administered 2016-08-06: 0.2 mg via ORAL

## 2016-08-06 MED ORDER — CLOPIDOGREL BISULFATE 75 MG PO TABS: 75.0000 mg | ORAL_TABLET | Freq: Every day | ORAL | 3 refills | Status: DC

## 2016-08-06 MED ORDER — AMLODIPINE BESYLATE 10 MG PO TABS: 10.0000 mg | ORAL_TABLET | Freq: Every day | ORAL | 3 refills | Status: DC

## 2016-08-06 MED ORDER — SUCRALFATE 1 GM/10ML PO SUSP: 1.0000 g | Freq: Three times a day (TID) | ORAL | 3 refills | Status: DC

## 2016-08-06 MED ORDER — PANTOPRAZOLE SODIUM 40 MG PO TBEC: 40.0000 mg | DELAYED_RELEASE_TABLET | Freq: Every day | ORAL | 3 refills | Status: DC

## 2016-08-06 MED FILL — ?AMLODIPINE BESYLATE 10 MG: 10 | 30 days supply | Qty: 30 | Fill #0

## 2016-08-06 MED FILL — CARAFATE 1 GM/10 ML SUSP: 1 | 11 days supply | Qty: 420 | Fill #0

## 2016-08-06 MED FILL — ?PANTOPRAZOLE SOD DR 40MG: 40 MG | 30 days supply | Qty: 30 | Fill #0

## 2016-08-06 MED FILL — CLOPIDOGREL 75 MG TABLET: 75 | 30 days supply | Qty: 30 | Fill #0

## 2016-08-06 NOTE — Progress Notes (Signed)
Ran out of carafate and amlodipine Need refills on everything

## 2016-08-06 NOTE — Progress Notes (Signed)
Subjective:  Patient ID: Michelle Joyce, female    DOB: July 14, 1970  Age: 46 y.o. 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  She was last seen by GI 2 months ago for follow-up of her duodenal ulcer; she did have abdominal MRI due to abnormal CT findings which revealed 14 mm left adrenal nodule with MR imaging characteristics consistent with benign adrenal adenoma. She denies headaches, blurry vision and has no nausea, vomiting or abdominal pain but is concerned pot its presence and has been reading up on it. Endorses amenorrhea for over one year and has not been on any contraception. Of note she has gained 24 pounds in the last 2 months.  Past Medical History:  Diagnosis Date  . Blood loss anemia   . Dissection of right iliac artery (HCC)   . Duodenal ulcer   . Gastrointestinal hemorrhage   . Hypertension   . Lactic acidosis     Past Surgical History:  Procedure Laterality Date  . COLONOSCOPY N/A 04/25/2016   Procedure: COLONOSCOPY;  Surgeon: Manus Gunning, MD;  Location: [facility] ENDOSCOPY;  Service: Gastroenterology;  Laterality: N/A;  . ESOPHAGOGASTRODUODENOSCOPY N/A 04/25/2016   Procedure: ESOPHAGOGASTRODUODENOSCOPY (EGD);  Surgeon: Manus Gunning, MD;  Location: [facility];  Service: Gastroenterology;  Laterality: N/A;  . LITHOTRIPSY    . MESENTERIC ARTERY BYPASS      No Known Allergies   Outpatient Medications Prior to Visit  Medication Sig Dispense Refill  . cyclobenzaprine (FLEXERIL) 10 MG tablet Take 1 tablet (10 mg total) by mouth 3 (three) times daily as needed  for muscle spasms. 30 tablet 0  . ferrous sulfate 300 (60 Fe) MG/5ML syrup Take 5 mLs (300 mg total) by mouth daily. 150 mL 3  . metoprolol succinate (TOPROL-XL) 25 MG 24 hr tablet Take 0.5 tablets (12.5 mg total) by mouth daily. 30 tablet 3  . traMADol (ULTRAM) 50 MG tablet Take 1 tablet (50 mg total) by mouth every 8 (eight) hours as needed. 30 tablet 0  . clopidogrel (PLAVIX) 75 MG tablet Take 1 tablet (75 mg total) by mouth daily. 30 tablet 0  . pantoprazole (PROTONIX) 40 MG tablet Take 1 tablet (40 mg total) by mouth 2 (two) times daily. 60 tablet 2  . amLODipine (NORVASC) 10 MG tablet Take 1 tablet (10 mg total) by mouth daily. (Patient not taking: Reported on 08/06/2016) 30 tablet 3  . sucralfate (CARAFATE) 1 GM/10ML suspension Take 10 mLs (1 g total) by mouth 4 (four) times daily -  with meals and at bedtime. (Patient not taking: Reported on 08/06/2016) 420 mL 1   No facility-administered medications prior to visit.     ROS Review of Systems  Constitutional: Negative for activity change, appetite change and fatigue.  HENT: Negative for congestion, sinus pressure and sore throat.   Eyes: Negative for visual disturbance.  Respiratory: Negative for cough, chest tightness, shortness of breath and wheezing.   Cardiovascular: Negative for chest pain and palpitations.  Gastrointestinal: Negative for abdominal distention, abdominal pain and constipation.  Endocrine: Negative for polydipsia.  Genitourinary: Positive for menstrual problem. Negative for dysuria and frequency.  Musculoskeletal: Negative for arthralgias and back  pain.  Skin: Negative for rash.  Neurological: Negative for tremors, light-headedness and numbness.  Hematological: Does not bruise/bleed easily.  Psychiatric/Behavioral: Negative for agitation and behavioral problems.    Objective:  BP (!) 192/110 (BP Location: Left Arm, Cuff Size: Small)   Pulse 85   Temp 98.4 F (36.9 C) (Oral)   Ht 5\' 6"  (1.676 m)   Wt 182 lb  3.2 oz (82.6 kg)   LMP 04/23/2014 (Approximate) Comment: negative beta HCG 04/23/16  SpO2 98%   BMI 29.41 kg/m   BP/Weight 08/06/2016 06/01/2016 0000000  Systolic BP [id number]  Diastolic BP [id number] 80 98  Wt. (Lbs) 182.2 158.4 157.2  BMI 29.41 27.19 26.98      Physical Exam  Constitutional: She is oriented to person, place, and time. She appears well-developed and well-nourished.  Cardiovascular: Normal rate, normal heart sounds and intact distal pulses.   No murmur heard. Pulmonary/Chest: Effort normal and breath sounds normal. She has no wheezes. She has no rales. She exhibits no tenderness.  Abdominal: Soft. Bowel sounds are normal. She exhibits no distension and no mass. There is no tenderness.  Musculoskeletal: Normal range of motion.  Neurological: She is alert and oriented to person, place, and time.      CLINICAL DATA:  Left adrenal lesion on prior CT.  EXAM: MRI ABDOMEN WITHOUT AND WITH CONTRAST  TECHNIQUE: Multiplanar multisequence MR imaging of the abdomen was performed both before and after the administration of intravenous contrast.  CONTRAST:  53mL MULTIHANCE GADOBENATE DIMEGLUMINE 529 MG/ML IV SOLN  COMPARISON:  Abdomen and pelvis CT 04/23/2016.  FINDINGS: Lower chest:  Unremarkable.  Hepatobiliary: No focal abnormality within the liver parenchyma. Layering tiny stones identified in the lumen of the gallbladder. No gallbladder wall thickening or pericholecystic fluid. No intrahepatic or extrahepatic biliary dilation.  Pancreas: No focal mass lesion. No dilatation of the main duct. No intraparenchymal cyst. No peripancreatic edema.  Spleen: No splenomegaly. No focal mass lesion.  Adrenals/Urinary Tract: Right adrenal gland normal. 14 mm left adrenal nodule is evident. This nodule decreases in signal intensity on out of phase T1 weighted imaging compared to the in phase sequence, consistent with benign adrenal adenoma. Kidneys are  normal in appearance bilaterally.  Stomach/Bowel: Stomach is nondistended. No gastric wall thickening. No evidence of outlet obstruction. Duodenum is normally positioned as is the ligament of Treitz. No small bowel or colon dilatation within the visualized abdomen.  Vascular/Lymphatic: No abdominal aortic aneurysm. No abdominal lymphadenopathy.  Other: Although anatomic pelvis is incompletely visualized , 4.0 cm rounded structures identified anterior to the uterus and cranial to the bladder, likely corresponding to the apparent pelvic hematoma seen on prior CT scan. Visualized portion appears decreased in the interval.  Musculoskeletal: No abnormal marrow enhancement within the visualized bony anatomy.  IMPRESSION: 1. 14 mm left adrenal nodule with MR imaging characteristics consistent with benign adrenal adenoma. 2. 4 cm lesion in the anterior anatomic pelvis likely corresponding to the apparent hematoma seen at this location on previous CT scan.   Electronically Signed   By: Misty Stanley M.D.   On: 06/25/2016 11:26  Assessment & Plan:   1. Essential hypertension Uncontrolled due to running out of medications I have stressed the importance of compliance Low-sodium diet Recent weight gain of 24 pounds in the last 2 months also contributory to uncontrolled blood pressure Advised on lifestyle modification and weight loss. - cloNIDine (CATAPRES) tablet 0.2 mg; Take 2 tablets (0.2 mg total) by mouth once. - amLODipine (  NORVASC) 10 MG tablet; Take 1 tablet (10 mg total) by mouth daily.  Dispense: 30 tablet; Refill: 3  2. Amenorrhea Given recent weight gain and amenorrhea may need to be worked up for PCO S - TSH - FSH/LH  3. Adrenal adenoma, left Discussed management options including labs, watchful waiting and referral We will also consider screening for pheochromocytoma down the road. - Cortisol  4. Duodenal ulcer with hemorrhage Asymptomatic Followed by  GI - sucralfate (CARAFATE) 1 GM/10ML suspension; Take 10 mLs (1 g total) by mouth 4 (four) times daily -  with meals and at bedtime.  Dispense: 420 mL; Refill: 3 - pantoprazole (PROTONIX) 40 MG tablet; Take 1 tablet (40 mg total) by mouth daily.  Dispense: 30 tablet; Refill: 3  5. Dissection of right iliac artery (HCC) Yet to see vascular surgery Her appointment comes up next week. - clopidogrel (PLAVIX) 75 MG tablet; Take 1 tablet (75 mg total) by mouth daily.  Dispense: 30 tablet; Refill: 3   Meds ordered this encounter  Medications  . cloNIDine (CATAPRES) tablet 0.2 mg  . amLODipine (NORVASC) 10 MG tablet    Sig: Take 1 tablet (10 mg total) by mouth daily.    Dispense:  30 tablet    Refill:  3  . sucralfate (CARAFATE) 1 GM/10ML suspension    Sig: Take 10 mLs (1 g total) by mouth 4 (four) times daily -  with meals and at bedtime.    Dispense:  420 mL    Refill:  3  . pantoprazole (PROTONIX) 40 MG tablet    Sig: Take 1 tablet (40 mg total) by mouth daily.    Dispense:  30 tablet    Refill:  3  . clopidogrel (PLAVIX) 75 MG tablet    Sig: Take 1 tablet (75 mg total) by mouth daily.    Dispense:  30 tablet    Refill:  3    Follow-up: Return in about 1 month (around 09/03/2016) for Follow-up on hypertension.   Arnoldo Morale MD

## 2016-08-07 LAB — CORTISOL: Cortisol, Plasma: 5.5 ug/dL

## 2016-08-07 LAB — FSH/LH
FSH: 84.9 m[IU]/mL
LH: 45.1 m[IU]/mL

## 2016-08-11 ENCOUNTER — Encounter (INDEPENDENT_AMBULATORY_CARE_PROVIDER_SITE_OTHER): Payer: Self-pay

## 2016-08-11 ENCOUNTER — Encounter: Payer: Self-pay | Admitting: Surgery

## 2016-08-11 ENCOUNTER — Ambulatory Visit (INDEPENDENT_AMBULATORY_CARE_PROVIDER_SITE_OTHER): Payer: Medicaid Other | Admitting: Surgery

## 2016-08-11 VITALS — BP 155/104 | HR 88 | Temp 97.5°F | Resp 18 | Ht 66.0 in | Wt 182.0 lb

## 2016-08-11 DIAGNOSIS — I7772 Dissection of iliac artery: Secondary | ICD-10-CM | POA: Diagnosis not present

## 2016-08-11 DIAGNOSIS — K551 Chronic vascular disorders of intestine: Secondary | ICD-10-CM | POA: Diagnosis not present

## 2016-08-11 NOTE — Progress Notes (Signed)
[facility]  Patient name: Michelle Joyce MRN: [id number] DOB: July 10, 1970 Sex: female   REASON FOR VISIT:    Follow up  [place]:    Michelle Joyce is a 46 y.o. female initially met in the hospital October 2017.  At that time, she had just moved from [place].  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  This was done for abdominal pain in [place].  Her procedure was complicated by a pseudoaneurysm to the right femoral cannulation site.  She had to undergo several surgical explorations for bleeding.  She presented with a open wound to that area.  During her hospitalization she had a CT scan which revealed a right external iliac and common femoral artery dissection and subacute blood in the pelvis.  During her hospitalization she was having melanotic and bright red stools which were being followed by GI.  She had subsequently been diagnosed with a duodenal ulcer which was the likely source for her abdominal pain.  The patient appears to made an excellent recovery.  She has gained approximately 30 pounds.  Her activity level is dramatically improved.  She continues to take dual antiplatelet therapy.   PAST MEDICAL HISTORY:   Past Medical History:  Diagnosis Date  . Blood loss anemia   . Dissection of right iliac artery (HCC)   . Duodenal ulcer   . Gastrointestinal hemorrhage   . Hypertension   . Lactic acidosis      FAMILY HISTORY:   Family History  Problem Relation Age of Onset  . Colon cancer Neg Hx   . Esophageal cancer Neg Hx   . Pancreatic cancer Neg Hx   . Stomach cancer Neg Hx   . Liver disease Neg Hx     SOCIAL HISTORY:   Social History  Substance Use Topics  . Smoking status: Current Every Day Smoker    Packs/day: 0.25    Years: 20.00    Types: Cigarettes  . Smokeless tobacco: Never Used  . Alcohol use 0.6 - 1.2 oz/week    1 - 2 Glasses  of wine per week     Comment: once weekly     ALLERGIES:   No Known Allergies   CURRENT MEDICATIONS:   Current Outpatient Prescriptions  Medication Sig Dispense Refill  . amLODipine (NORVASC) 10 MG tablet Take 1 tablet (10 mg total) by mouth daily. 30 tablet 3  . clopidogrel (PLAVIX) 75 MG tablet Take 1 tablet (75 mg total) by mouth daily. 30 tablet 3  . cyclobenzaprine (FLEXERIL) 10 MG tablet Take 1 tablet (10 mg total) by mouth 3 (three) times daily as needed for muscle spasms. 30 tablet 0  . ferrous sulfate 300 (60 Fe) MG/5ML syrup Take 5 mLs (300 mg total) by mouth daily. 150 mL 3  . metoprolol succinate (TOPROL-XL) 25 MG 24 hr tablet Take 0.5 tablets (12.5 mg total) by mouth daily. 30 tablet 3  . pantoprazole (PROTONIX) 40 MG tablet Take 1 tablet (40 mg total) by mouth daily. 30 tablet 3  . sucralfate (CARAFATE) 1 GM/10ML suspension Take 10 mLs (1 g total) by mouth 4 (four) times daily -  with meals and at bedtime. 420 mL 3  . traMADol (ULTRAM) 50 MG tablet Take 1 tablet (50 mg total) by mouth every 8 (eight) hours as needed. 30 tablet 0   No current facility-administered medications for this visit.     REVIEW OF SYSTEMS:   _0  denotes  positive finding, _0  denotes negative finding Cardiac  Comments:  Chest pain or chest pressure:    Shortness of breath upon exertion:    Short of breath when lying flat:    Irregular heart rhythm:        Vascular    Pain in calf, thigh, or hip brought on by ambulation:    Pain in feet at night that wakes you up from your sleep:     Blood clot in your veins:    Leg swelling:         Pulmonary    Oxygen at home:    Productive cough:     Wheezing:         Neurologic    Sudden weakness in arms or legs:     Sudden numbness in arms or legs:     Sudden onset of difficulty speaking or slurred speech:    Temporary loss of vision in one eye:     Problems with dizziness:         Gastrointestinal    Blood in stool:     Vomited blood:          Genitourinary    Burning when urinating:     Blood in urine:        Psychiatric    Major depression:         Hematologic    Bleeding problems:    Problems with blood clotting too easily:        Skin    Rashes or ulcers:        Constitutional    Fever or chills:      PHYSICAL EXAM:   Vitals:   08/11/16 0953 08/11/16 0956  BP: (!) 173/109 (!) 155/104  Pulse: 88   Resp: 18   Temp: 97.5 F (36.4 C)   TempSrc: Oral   SpO2: 98%   Weight: 182 lb (82.6 kg)   Height: _1  (1.676 m)     GENERAL: The patient is a well-nourished female, in no acute distress. The vital signs are documented above. CARDIAC: There is a regular rate and rhythm.  VASCULAR: Palpable pedal pulses bilaterally PULMONARY: Non-labored respirations ABDOMEN: Soft and non-tender with normal pitched bowel sounds.  MUSCULOSKELETAL: There are no major deformities or cyanosis. NEUROLOGIC: No focal weakness or paresthesias are detected. SKIN: There are no ulcers or rashes noted. PSYCHIATRIC: The patient has a normal affect.  STUDIES:   None  MEDICAL ISSUES:   S and a stenting: This was done in [place] for abdominal pain.  In retrospect I wonder if her abdominal pain was more related to her duodenal ulcer that her mesenteric narrowing.  Regardless, she is now gaining weight and pain free.  I will have her follow-up in 6 months with a abdominal mesenteric ultrasound.  If her stent remains widely patent, she can consider discontinuing her Plavix.  I would continue a 81 mg aspirin  Right iliofemoral dissection: The patient has palpable pedal pulses.  I would not recommend any intervention on this at this time.  I have her scheduled for an ultrasound follow-up in 6 months.  If she develops aneurysmal degeneration or diminished pulses to her right leg, consideration for angiography could be made    Annamarie Major, MD [facility] [phone] Pager [phone]

## 2016-08-16 ENCOUNTER — Other Ambulatory Visit: Payer: Self-pay | Admitting: Family Medicine

## 2016-08-16 DIAGNOSIS — I10 Essential (primary) hypertension: Secondary | ICD-10-CM

## 2016-08-16 MED ORDER — METOPROLOL SUCCINATE ER 25 MG PO TB24: 12.5000 mg | ORAL_TABLET | Freq: Every day | ORAL | 3 refills | Status: DC

## 2016-08-20 NOTE — Addendum Note (Signed)
Addended by: Lianne Cure A on: 08/20/2016 11:50 AM   Modules accepted: Orders

## 2016-08-27 ENCOUNTER — Ambulatory Visit: Payer: Medicaid Other | Admitting: Gastroenterology

## 2016-09-01 ENCOUNTER — Telehealth: Payer: Self-pay | Admitting: Family Medicine

## 2016-09-01 DIAGNOSIS — I10 Essential (primary) hypertension: Secondary | ICD-10-CM

## 2016-09-01 DIAGNOSIS — I7772 Dissection of iliac artery: Secondary | ICD-10-CM

## 2016-09-01 DIAGNOSIS — K264 Chronic or unspecified duodenal ulcer with hemorrhage: Secondary | ICD-10-CM

## 2016-09-01 MED ORDER — SUCRALFATE 1 GM/10ML PO SUSP: 1.0000 g | Freq: Three times a day (TID) | ORAL | 3 refills | Status: AC

## 2016-09-01 MED ORDER — CYCLOBENZAPRINE HCL 10 MG PO TABS: 10.0000 mg | ORAL_TABLET | Freq: Three times a day (TID) | ORAL | 1 refills | Status: AC | PRN

## 2016-09-01 MED ORDER — AMLODIPINE BESYLATE 10 MG PO TABS: 10.0000 mg | ORAL_TABLET | Freq: Every day | ORAL | 3 refills | Status: AC

## 2016-09-01 MED ORDER — FERROUS SULFATE 300 (60 FE) MG/5ML PO SYRP: 300.0000 mg | ORAL_SOLUTION | Freq: Every day | ORAL | 1 refills | Status: DC

## 2016-09-01 MED ORDER — PANTOPRAZOLE SODIUM 40 MG PO TBEC: 40.0000 mg | DELAYED_RELEASE_TABLET | Freq: Every day | ORAL | 1 refills | Status: AC

## 2016-09-01 MED ORDER — METOPROLOL SUCCINATE ER 25 MG PO TB24: 12.5000 mg | ORAL_TABLET | Freq: Every day | ORAL | 1 refills | Status: AC

## 2016-09-01 MED ORDER — CLOPIDOGREL BISULFATE 75 MG PO TABS: 75.0000 mg | ORAL_TABLET | Freq: Every day | ORAL | 1 refills | Status: AC

## 2016-09-01 MED FILL — AMLODIPINE BESYLATE 10 MG T: 10 | 30 days supply | Qty: 30 | Fill #0

## 2016-09-01 MED FILL — CARAFATE 1 GM/10 ML SUSP: 1 | 11 days supply | Qty: 420 | Fill #0

## 2016-09-01 MED FILL — CLOPIDOGREL 75 MG TABLET: 75 | 30 days supply | Qty: 30 | Fill #0

## 2016-09-01 MED FILL — METOPROLOL SUCC ER 25 MG TA: 25 | 30 days supply | Qty: 15 | Fill #0

## 2016-09-01 MED FILL — PANTOPRAZOLE SOD DR 40 MG T: 40 | 30 days supply | Qty: 30 | Fill #0

## 2016-09-01 NOTE — Telephone Encounter (Signed)
Patient aware.

## 2016-09-01 NOTE — Telephone Encounter (Signed)
Done

## 2016-09-01 NOTE — Telephone Encounter (Signed)
Patient is leaving out of town Saturday and would like to know if she can have a refill on all of her current meds and also would like to know if she is able to get 90 day refills. Please f/u with patient at [phone]

## 2016-09-02 ENCOUNTER — Other Ambulatory Visit: Payer: Self-pay | Admitting: Family Medicine

## 2016-09-02 MED ORDER — FERROUS SULFATE 325 (65 FE) MG PO TABS: 325.0000 mg | ORAL_TABLET | Freq: Two times a day (BID) | ORAL | 1 refills | Status: AC

## 2016-09-02 MED FILL — FERROUS SULFATE 325 MG TAB: 325 (65 FE) | 30 days supply | Qty: 60 | Fill #0

## 2016-09-03 MED FILL — CLOPIDOGREL 75 MG TABLET: 75 | 30 days supply | Qty: 30 | Fill #1

## 2016-09-03 MED FILL — PANTOPRAZOLE SOD DR 40 MG T: 40 | 30 days supply | Qty: 30 | Fill #1

## 2016-09-03 MED FILL — AMLODIPINE BESYLATE 10 MG T: 10 | 30 days supply | Qty: 30 | Fill #1

## 2016-09-03 MED FILL — METOPROLOL SUCC ER 25 MG TA: 25 | 30 days supply | Qty: 15 | Fill #1

## 2017-01-25 ENCOUNTER — Encounter: Payer: Self-pay | Admitting: Family

## 2017-02-14 ENCOUNTER — Ambulatory Visit (HOSPITAL_COMMUNITY): Payer: Medicaid Other | Attending: Surgery

## 2017-02-14 ENCOUNTER — Ambulatory Visit (HOSPITAL_COMMUNITY): Admission: RE | Admit: 2017-02-14 | Payer: Medicaid Other | Source: Ambulatory Visit

## 2017-02-14 ENCOUNTER — Ambulatory Visit: Payer: Medicaid Other | Admitting: Family

## 2018-08-20 IMAGING — MR MR THORACIC SPINE W/O CM
4 of 13 series · 17 of 48 positions shown · non-contrast
Comparison: CT angiogram abdomen and pelvis 04/23/2016.

CLINICAL DATA: 45-year-old female with history of superior
mesenteric artery stenting with development of a pseudo aneurysm and
retroperitoneal hematoma. Mid right back pain Subsequent encounter.

EXAM:
MRI THORACIC AND LUMBAR SPINE WITHOUT CONTRAST
TECHNIQUE: Multiplanar and multiecho pulse sequences of the thoracic and lumbar
spine were obtained without intravenous contrast.

[Series 4: T2 · sagittal · 4.0mm · 0.55mm/px · 3 of 14 slices shown (1 of 4)]
[im 1/14]
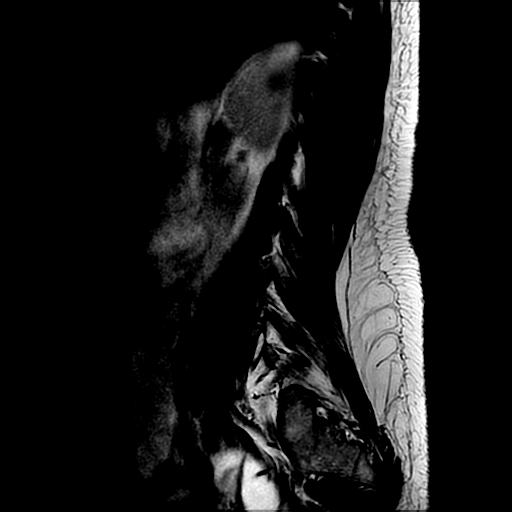
[im 7/14]
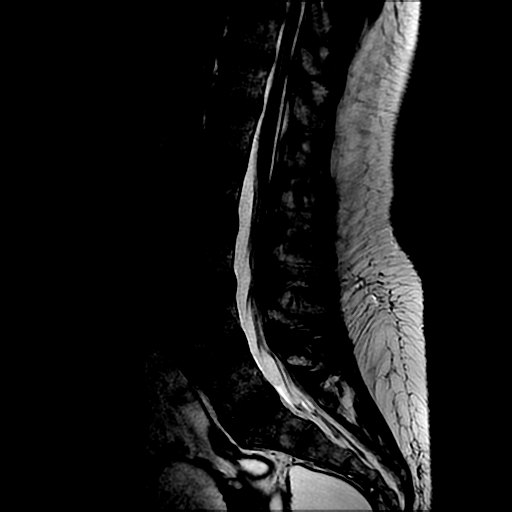
[im 14/14]
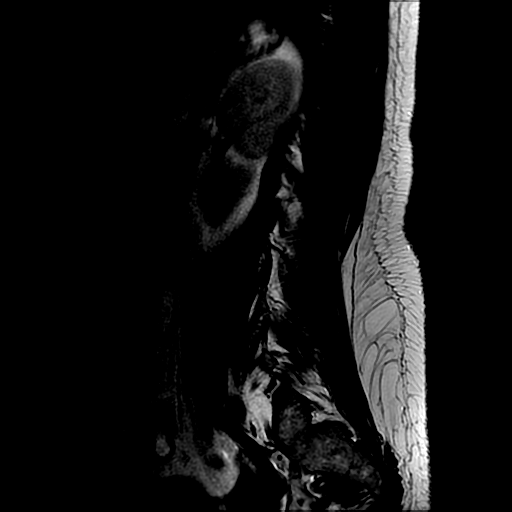

[Series 8: T2 · axial · 4.0mm · 0.39mm/px · z∈[+34,+269]mm · 6 of 40 slices shown (2 of 4)]
[im 1/40]
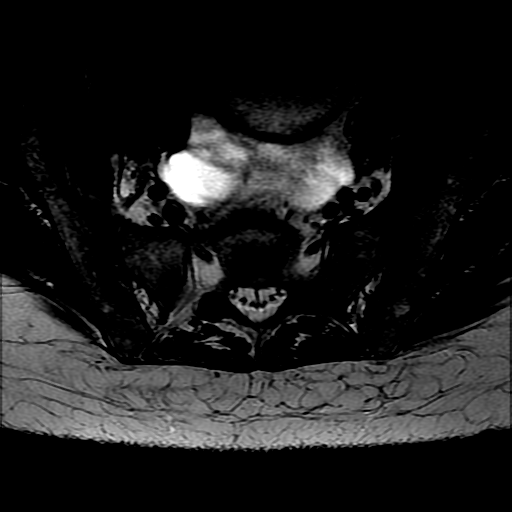
[im 8/40]
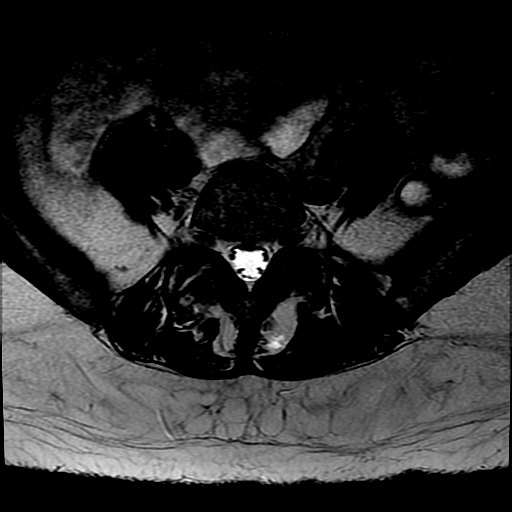
[im 16/40]
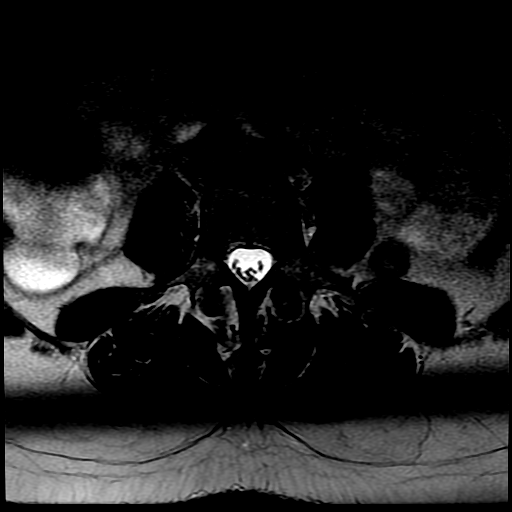
[im 24/40]
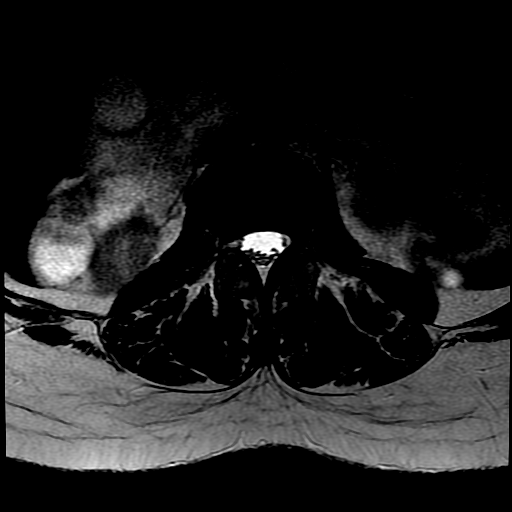
[im 32/40]
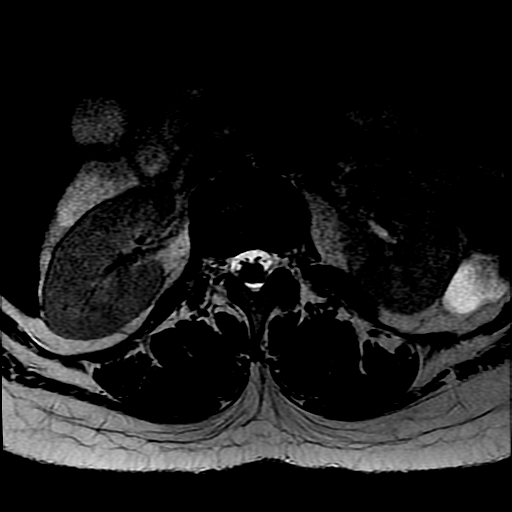
[im 40/40]
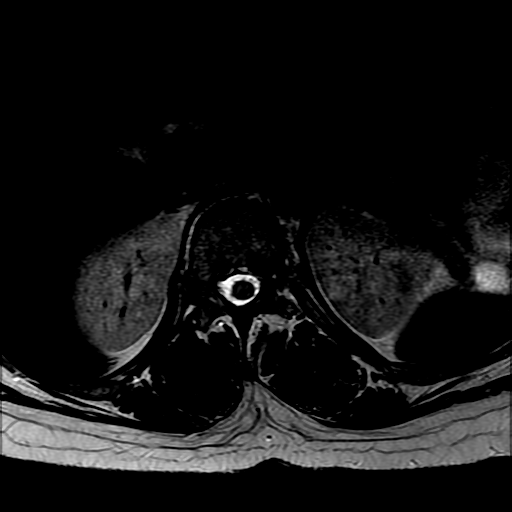

[Series 13: T2 · sagittal · 3.0mm · 0.64mm/px · 2 of 16 slices shown (3 of 4)]
[im 1/16]
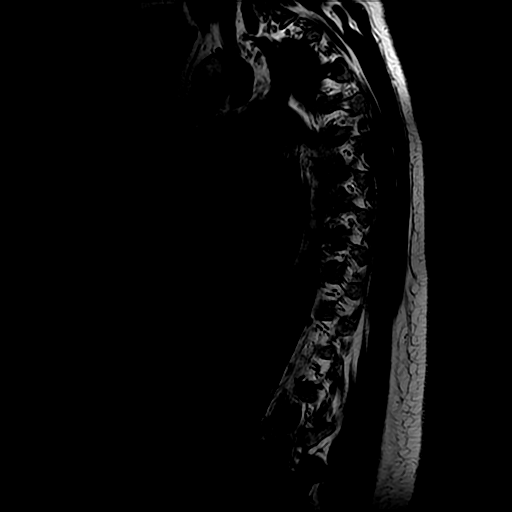
[im 16/16]
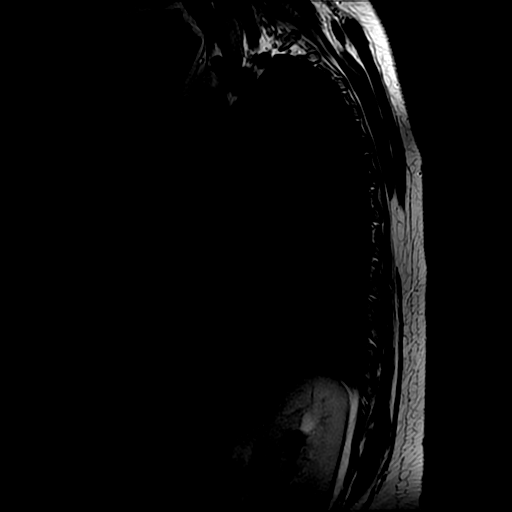

[Series 16: T2 · axial · 4.0mm · 0.43mm/px · z∈[+237,+478]mm · 6 of 42 slices shown (4 of 4)]
[im 1/42]
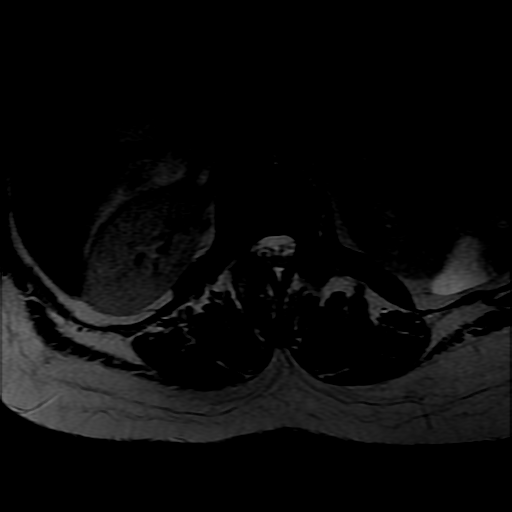
[im 9/42]
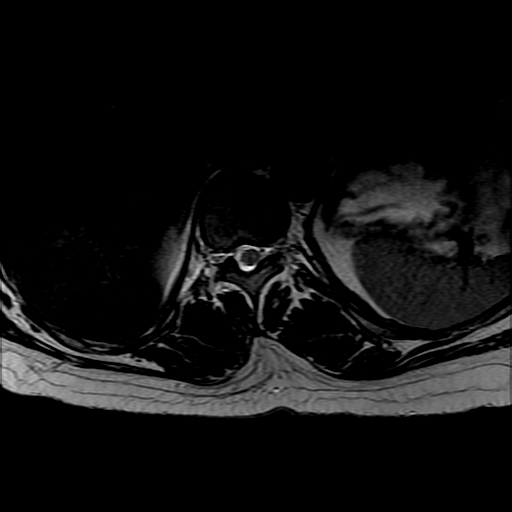
[im 17/42]
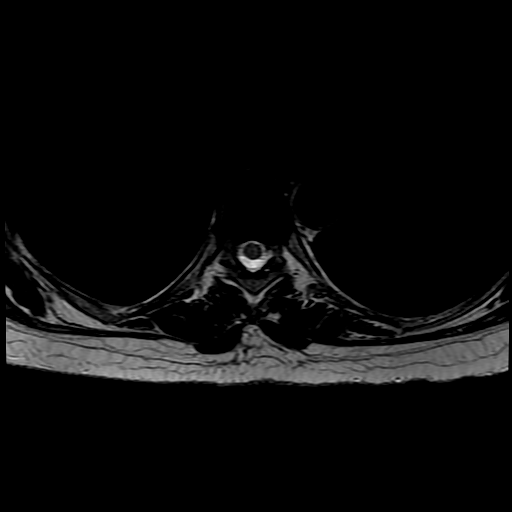
[im 25/42]
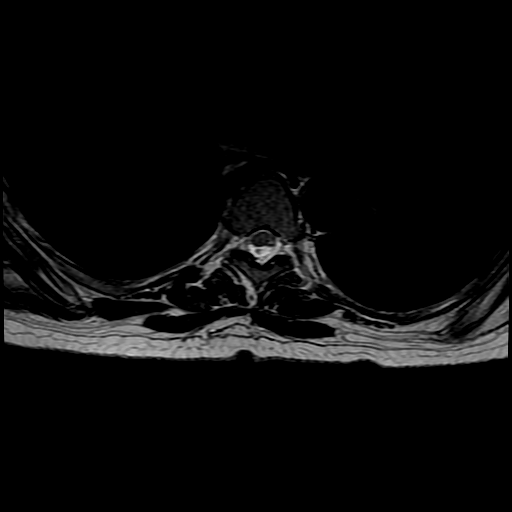
[im 33/42]
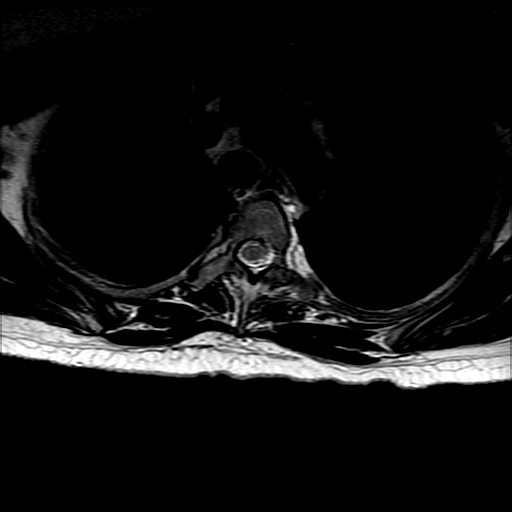
[im 42/42]
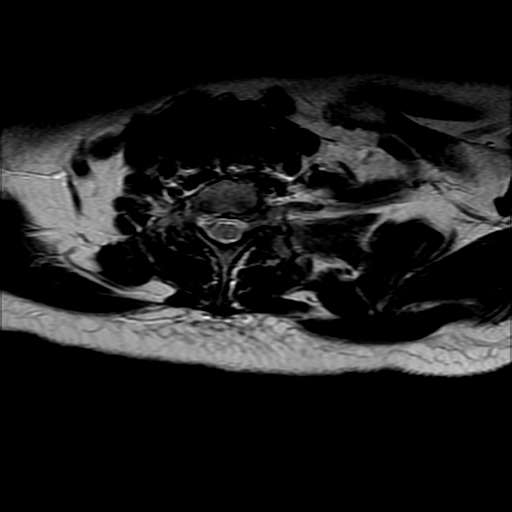

[17 of 48 positions shown; findings below may reference images not displayed]

FINDINGS: Exam is motion degraded.

MRI THORACIC SPINE FINDINGS

Alignment:  Scoliosis thoracic spine convex left.

Vertebrae: No worrisome abnormality.

Cord: No focal cord signal abnormality. Abnormal configuration of
the dorsal aspect of the cord at the T5 level. Possibility of a
small arachnoid cyst causing mild anterior displacement of the cord
is raised. This is difficult to discern with certainty. This does
not have signal characteristics of acute hemorrhage. Loculated
cerebral spinal fluid at this level from prior insult is also
consideration. Scoliosis may contribute to this appearance.

Paraspinal and other soft tissues: No worrisome abnormality.
Top-normal size thyroid gland.

Cervical spondylotic changes C3-4 thru C5-6 incompletely assessed.

Disc levels:

T1-2:  Mild left facet degenerative changes.

T2-3:  Mild left facet degenerative changes.

T3-4:  Mild rotation.

T4-5:  Mild rotation.  Minimal spur greater to the right.

T5-6:  Minimal facet degenerative changes.

T6-7:  Minimal spur greater to the right.

T7-8:  Negative.

T8-9:  Negative.

T9-10:  Minimal spur.

T10-11: Mild bulge and spur greater to left. Slight impression left
ventral thecal sac. Slight rotation. Minimal facet bony overgrowth.

T11-12: Mild left facet degenerative changes. Minimal rotation
minimal bulge.

T12-L1:  Mild facet degenerative change greater on the left.

Exam is motion degraded.

MRI LUMBAR SPINE FINDINGS

Segmentation:  Last fully open disc space labeled L5.

Alignment:  Minimal curvature.

Vertebrae:  No worrisome abnormality.

Conus medullaris: Extends to the upper L2 level level and appears
normal. No evidence of a epidural hematoma.

Paraspinal and other soft tissues: Complex 5 cm mass superior to the
bladder. This may represent hematoma or pseudoaneurysm from vascular
surgery. The fact that this communicates with right femoral artery
region where patient has had prior surgeries suggests this is not a
primary ovarian lesion. Superimposed infection not excluded if of
clinical concern. Fluid filled bowel. Gallstones.

Disc levels:

L1-2:  Mild facet degenerative changes.

L2-3:  Minimal left facet bony overgrowth.

L3-4: Minimal bulge greater laterally. Minimal facet bony
overgrowth.

L4-5: Moderate facet degenerative changes greater on the right.
Ligamentum flavum hypertrophy. Mild bulge. No significant spinal
stenosis or foraminal narrowing.

L5-S1: Prominent facet degenerative changes greater on the right.
Mild foraminal narrowing greater on left. No nerve root compression.
No significant spinal stenosis.
IMPRESSION: Exam is motion degraded.

MR THORACIC SPINE IMPRESSION

Scoliosis thoracic spine convex left.

Vertebrae: No worrisome abnormality.

Abnormal configuration of the dorsal aspect of the cord at the T5
level. Possibility of a small arachnoid cyst causing mild anterior
displacement of the cord is raised. This is difficult to discern
with certainty. This does not have signal characteristics of acute
hemorrhage. Loculated cerebral spinal fluid at this level from prior
insult is also consideration. Scoliosis and abnormal cerebral spinal
fluid flow may contribute to this appearance.

Minimal to mild degenerative changes most prominent T10-11 level
without significant spinal stenosis.

MR LUMBAR SPINE IMPRESSION

No evidence of a epidural hematoma.

Complex 5 cm mass superior to the bladder. This may represent
hematoma or pseudoaneurysm from vascular surgery.

Fluid filled bowel.

L4-5 moderate facet degenerative changes greater on the right.
Ligamentum flavum hypertrophy. Mild bulge. No significant spinal
stenosis or foraminal narrowing.

L5-S1 prominent facet degenerative changes greater on the right.
Mild foraminal narrowing greater on left. No nerve root compression.
No significant spinal stenosis.
